# Patient Record
Sex: Female | Born: 1950 | Race: White | Hispanic: No | Marital: Married | Smoking: Never smoker
Health system: Southern US, Community
[De-identification: ages and names within clinical notes are randomized; demographics above are authoritative.]

## PROBLEM LIST (undated history)

## (undated) DIAGNOSIS — J42 Unspecified chronic bronchitis: Secondary | ICD-10-CM

## (undated) DIAGNOSIS — G4733 Obstructive sleep apnea (adult) (pediatric): Principal | ICD-10-CM

## (undated) DIAGNOSIS — K589 Irritable bowel syndrome without diarrhea: Secondary | ICD-10-CM

## (undated) DIAGNOSIS — E7800 Pure hypercholesterolemia, unspecified: Secondary | ICD-10-CM

## (undated) DIAGNOSIS — K5792 Diverticulitis of intestine, part unspecified, without perforation or abscess without bleeding: Secondary | ICD-10-CM

## (undated) DIAGNOSIS — Z8619 Personal history of other infectious and parasitic diseases: Secondary | ICD-10-CM

## (undated) DIAGNOSIS — M1990 Unspecified osteoarthritis, unspecified site: Secondary | ICD-10-CM

## (undated) DIAGNOSIS — F329 Major depressive disorder, single episode, unspecified: Secondary | ICD-10-CM

## (undated) DIAGNOSIS — I493 Ventricular premature depolarization: Secondary | ICD-10-CM

## (undated) DIAGNOSIS — R51 Headache: Secondary | ICD-10-CM

## (undated) DIAGNOSIS — G4761 Periodic limb movement disorder: Secondary | ICD-10-CM

## (undated) DIAGNOSIS — Z973 Presence of spectacles and contact lenses: Secondary | ICD-10-CM

## (undated) DIAGNOSIS — C50919 Malignant neoplasm of unspecified site of unspecified female breast: Secondary | ICD-10-CM

## (undated) DIAGNOSIS — S83209A Unspecified tear of unspecified meniscus, current injury, unspecified knee, initial encounter: Secondary | ICD-10-CM

## (undated) DIAGNOSIS — F419 Anxiety disorder, unspecified: Secondary | ICD-10-CM

## (undated) DIAGNOSIS — C801 Malignant (primary) neoplasm, unspecified: Secondary | ICD-10-CM

## (undated) DIAGNOSIS — F32A Depression, unspecified: Secondary | ICD-10-CM

## (undated) DIAGNOSIS — IMO0001 Reserved for inherently not codable concepts without codable children: Secondary | ICD-10-CM

## (undated) DIAGNOSIS — IMO0002 Reserved for concepts with insufficient information to code with codable children: Secondary | ICD-10-CM

## (undated) DIAGNOSIS — K219 Gastro-esophageal reflux disease without esophagitis: Secondary | ICD-10-CM

## (undated) DIAGNOSIS — J189 Pneumonia, unspecified organism: Secondary | ICD-10-CM

## (undated) HISTORY — DX: Headache: R51

## (undated) HISTORY — PX: KNEE SURGERY: SHX244

## (undated) HISTORY — PX: OSTEOTOMY: SHX137

## (undated) HISTORY — DX: Gastro-esophageal reflux disease without esophagitis: K21.9

## (undated) HISTORY — DX: Depression, unspecified: F32.A

## (undated) HISTORY — DX: Pure hypercholesterolemia, unspecified: E78.00

## (undated) HISTORY — DX: Obstructive sleep apnea (adult) (pediatric): G47.33

## (undated) HISTORY — PX: DILATION AND CURETTAGE OF UTERUS: SHX78

## (undated) HISTORY — DX: Malignant neoplasm of unspecified site of unspecified female breast: C50.919

## (undated) HISTORY — DX: Reserved for concepts with insufficient information to code with codable children: IMO0002

## (undated) HISTORY — DX: Irritable bowel syndrome, unspecified: K58.9

## (undated) HISTORY — DX: Reserved for inherently not codable concepts without codable children: IMO0001

## (undated) HISTORY — DX: Major depressive disorder, single episode, unspecified: F32.9

## (undated) HISTORY — DX: Ventricular premature depolarization: I49.3

## (undated) HISTORY — DX: Periodic limb movement disorder: G47.61

---

## 1982-08-01 HISTORY — PX: APPENDECTOMY: SHX54

## 2005-09-09 ENCOUNTER — Other Ambulatory Visit: Admission: RE | Admit: 2005-09-09 | Discharge: 2005-09-09 | Payer: Self-pay | Admitting: Obstetrics and Gynecology

## 2008-01-29 ENCOUNTER — Ambulatory Visit: Payer: Self-pay | Admitting: Internal Medicine

## 2008-02-12 ENCOUNTER — Ambulatory Visit: Payer: Self-pay | Admitting: Internal Medicine

## 2008-03-17 ENCOUNTER — Ambulatory Visit: Payer: Self-pay | Admitting: Internal Medicine

## 2008-12-15 ENCOUNTER — Emergency Department (HOSPITAL_COMMUNITY): Admission: EM | Admit: 2008-12-15 | Discharge: 2008-12-15 | Payer: Self-pay | Admitting: Emergency Medicine

## 2010-11-09 LAB — POCT I-STAT, CHEM 8
Calcium, Ion: 1.12 mmol/L (ref 1.12–1.32)
Chloride: 107 mEq/L (ref 96–112)
Glucose, Bld: 141 mg/dL — ABNORMAL HIGH (ref 70–99)
HCT: 45 % (ref 36.0–46.0)

## 2010-11-09 LAB — DIFFERENTIAL
Basophils Absolute: 0 10*3/uL (ref 0.0–0.1)
Lymphocytes Relative: 2 % — ABNORMAL LOW (ref 12–46)
Lymphs Abs: 0.3 10*3/uL — ABNORMAL LOW (ref 0.7–4.0)
Monocytes Absolute: 0.2 10*3/uL (ref 0.1–1.0)
Monocytes Relative: 2 % — ABNORMAL LOW (ref 3–12)
Neutro Abs: 11.6 10*3/uL — ABNORMAL HIGH (ref 1.7–7.7)

## 2010-11-09 LAB — CBC
Hemoglobin: 15.1 g/dL — ABNORMAL HIGH (ref 12.0–15.0)
RBC: 4.65 MIL/uL (ref 3.87–5.11)
WBC: 12.1 10*3/uL — ABNORMAL HIGH (ref 4.0–10.5)

## 2010-11-09 LAB — COMPREHENSIVE METABOLIC PANEL
Albumin: 3.8 g/dL (ref 3.5–5.2)
BUN: 14 mg/dL (ref 6–23)
Chloride: 106 mEq/L (ref 96–112)
Creatinine, Ser: 0.82 mg/dL (ref 0.4–1.2)
GFR calc non Af Amer: >60 mL/min (ref >60)
Glucose, Bld: 139 mg/dL — ABNORMAL HIGH (ref 70–99)
Total Bilirubin: 0.8 mg/dL (ref 0.3–1.2)

## 2010-11-09 LAB — URINE MICROSCOPIC-ADD ON

## 2010-11-09 LAB — LIPASE, BLOOD: Lipase: 22 U/L (ref 11–59)

## 2010-11-09 LAB — URINALYSIS, ROUTINE W REFLEX MICROSCOPIC
Glucose, UA: NEGATIVE mg/dL
Ketones, ur: NEGATIVE mg/dL
Protein, ur: NEGATIVE mg/dL

## 2013-03-06 ENCOUNTER — Encounter: Payer: Self-pay | Admitting: Neurology

## 2013-03-06 ENCOUNTER — Ambulatory Visit (INDEPENDENT_AMBULATORY_CARE_PROVIDER_SITE_OTHER): Payer: BC Managed Care – PPO | Admitting: Neurology

## 2013-03-06 VITALS — BP 125/78 | HR 65 | Temp 97.8°F | Ht 63.0 in | Wt 192.0 lb

## 2013-03-06 DIAGNOSIS — G4733 Obstructive sleep apnea (adult) (pediatric): Secondary | ICD-10-CM

## 2013-03-06 NOTE — Patient Instructions (Signed)

## 2013-03-06 NOTE — Progress Notes (Signed)
Subjective:    Patient ID: Connie [name] is a 62 y.o. female.  HPI  Huston Foley, MD, PhD [facility] [address] [address] [place], [place] [address]  Dear Dr. Clelia Croft,   I saw your patient, Connie [name], upon Your kind request in my neurologic clinic today for initial consultation of her sleep disorder, in particular concern for obstructive sleep apnea. The patient is unaccompanied today. As you know, Ms. Tozer is a very pleasant 62 year old right-handed woman with an underlying medical history of hypertension, lung disease, osteoarthritis, reflux disease, hyperlipidemia, depression, migraines and irritable bowel syndrome as well as overweight status has been experiencing daytime somnolence for several months, especially on her days off. 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 She is known to snore.  Her typical bedtime is reported to be around 10 to 11 PM and usual wake time is around 5 AM. Sleep onset typically occurs within a few minutes, but she does take benadryl 50 mg each night. She reports feeling occasionally rested upon awakening. She wakes up on an average 2 in the middle of the night and has to go to the bathroom 1 times on a typical night. She admits to rare morning headaches.  She reports excessive daytime somnolence (EDS) and Her Epworth Sleepiness Score (ESS) is 12/24 today. She has not fallen asleep while driving. The patient has been taking an unplanned nap on her days off, which is usually 45 minutes to 1 hour long. She denies dreaming in a nap and reports feeling refreshed after a nap.  She has been known to snore for the past many years. Snoring is reportedly moderate to loud, and associated with choking sounds. The patient denies a sense of choking or strangling feeling. There is no report of nighttime reflux, with rare nighttime cough experienced. The patient has not noted any RLS symptoms and is not known to  kick while asleep or before falling asleep. She is a restless sleeper and in the morning, the bed is quite disheveled.   She denies cataplexy, sleep paralysis, hypnagogic or hypnopompic hallucinations, or sleep attacks. She does not report any vivid dreams, nightmares, dream enactments, or parasomnias, such as sleep talking, but she has some sleep walking. The patient has not had a sleep study or a home sleep test.  She consumes 1 caffeinated beverage per day, usually in the form of coffee.   Her Past Medical History Is Significant For: Past Medical History  Diagnosis Date  . IBS (irritable bowel syndrome)   . Anemia   . GERD (gastroesophageal reflux disease)   . Chronic bronchitis   . Headache(784.0)   . High cholesterol   . Depression     Her Past Surgical History Is Significant For: Past Surgical History  Procedure Laterality Date  . Knee surgery Left 2000-2001    total of 2  . Appendectomy  1984    Her Family History Is Significant For: Family History  Problem Relation Age of Onset  . Cancer Father     Her Social History Is Significant For: History   Social History  . Marital Status: Married    Spouse Name: N/A    Number of Children: 4  . Years of Education: college   Occupational History  . [employer].    Social History Main Topics  . Smoking status: Never Smoker   . Smokeless tobacco: None  . Alcohol Use: Yes  .  Drug Use: No  . Sexually Active: None   Other Topics Concern  . None   Social History Narrative  . None    Her Allergies Are:  No Known Allergies:   Her Current Medications Are:  Outpatient Encounter Prescriptions as of 03/06/2013  Medication Sig Dispense Refill  . aspirin 81 MG tablet Take 81 mg by mouth daily.      Marland Kitchen atenolol (TENORMIN) 25 MG tablet Take 25 mg by mouth daily.       Marland Kitchen atorvastatin (LIPITOR) 40 MG tablet Take 40 mg by mouth daily.       Marland Kitchen b complex vitamins tablet Take 1 tablet by mouth daily.      .  butalbital-aspirin-caffeine (FIORINAL) 50-325-40 MG per capsule 2 capsules 2 (two) times daily as needed.       . cyclobenzaprine (FLEXERIL) 10 MG tablet Take 10 mg by mouth daily as needed for muscle spasms.      Marland Kitchen estradiol (ESTRACE) 0.5 MG tablet Take 0.5 mg by mouth daily.       . magnesium 30 MG tablet Take 400 mg by mouth daily.      . medroxyPROGESTERone (PROVERA) 2.5 MG tablet Take 2.5 mg by mouth daily.       . Multiple Vitamin (MULTIVITAMIN) tablet Take 1 tablet by mouth daily.      Marland Kitchen omeprazole (PRILOSEC) 10 MG capsule Take 10 mg by mouth daily.      . sertraline (ZOLOFT) 100 MG tablet Take 100 mg by mouth daily.       . traMADol (ULTRAM) 50 MG tablet Take 50 mg by mouth every 6 (six) hours as needed for pain.      . [DISCONTINUED] fluticasone (FLONASE) 50 MCG/ACT nasal spray       . [DISCONTINUED] hyoscyamine (LEVSIN, ANASPAZ) 0.125 MG tablet        No facility-administered encounter medications on file as of 03/06/2013.   Review of Systems  Constitutional: Positive for fatigue.  Respiratory:       Snoring  Musculoskeletal:       Joint pain , Aching Muscles  Allergic/Immunologic: Positive for environmental allergies.  Neurological: Positive for headaches.  Psychiatric/Behavioral:       Depression    Objective:  Neurologic Exam  Physical Exam Physical Examination:   Filed Vitals:   03/06/13 0954  BP: 125/78  Pulse: 65  Temp: 97.8 F (36.6 C)    General Examination: The patient is a very pleasant 62 y.o. female in no acute distress. She appears well-developed and well-nourished and well groomed.   HEENT: Normocephalic, atraumatic, pupils are equal, round and reactive to light and accommodation. Funduscopic exam is normal with sharp disc margins noted. Extraocular tracking is good without limitation to gaze excursion or nystagmus noted. Normal smooth pursuit is noted. Hearing is grossly intact. Tympanic membranes are clear bilaterally. Face is symmetric with normal  facial animation and normal facial sensation. Speech is clear with no dysarthria noted. There is no hypophonia. There is no lip, neck/head, jaw or voice tremor. Neck is supple with full range of passive and active motion. There are no carotid bruits on auscultation. Oropharynx exam reveals: mild mouth dryness, adequate dental hygiene and moderate airway crowding, due to narrow airway, tonsillar size of 1+ and elongated uvula. Mallampati is class II. Tongue protrudes centrally and palate elevates symmetrically. Neck size is 15.5 inches.   Chest: Clear to auscultation without wheezing, rhonchi or crackles noted.  Heart: S1+S2+0, regular and normal without murmurs,  rubs or gallops noted.   Abdomen: Soft, non-tender and non-distended with normal bowel sounds appreciated on auscultation.  Extremities: There is no pitting edema in the distal lower extremities bilaterally. Pedal pulses are intact.  Skin: Warm and dry without trophic changes noted. There are no varicose veins.  Musculoskeletal: exam reveals no obvious joint deformities, tenderness or joint swelling or erythema.   Neurologically:  Mental status: The patient is awake, alert and oriented in all 4 spheres. Her memory, attention, language and knowledge are appropriate. There is no aphasia, agnosia, apraxia or anomia. Speech is clear with normal prosody and enunciation. Thought process is linear. Mood is congruent and affect is normal.  Cranial nerves are as described above under HEENT exam. In addition, shoulder shrug is normal with equal shoulder height noted. Motor exam: Normal bulk, strength and tone is noted. There is no drift, tremor or rebound. Romberg is negative. Reflexes are 2+ throughout. Toes are downgoing bilaterally. Fine motor skills are intact with normal finger taps, normal hand movements, normal rapid alternating patting, normal foot taps and normal foot agility.  Cerebellar testing shows no dysmetria or intention tremor on  finger to nose testing. Heel to shin is unremarkable bilaterally. There is no truncal or gait ataxia.  Sensory exam is intact to light touch, pinprick, vibration, temperature sense and proprioception in the upper and lower extremities.  Gait, station and balance are unremarkable. No veering to one side is noted. No leaning to one side is noted. Posture is age-appropriate and stance is narrow based. No problems turning are noted. She turns en bloc. Tandem walk is unremarkable. Intact toe and heel stance is noted.                Assessment and Plan:   In summary, CAMI DELAWDER is a very pleasant 62 y.o.-year old female with a history and physical exam concerning for obstructive sleep apnea (OSA). I had a long chat with the patient about my findings and the diagnosis, its prognosis and treatment options. We talked about medical treatments and non-pharmacological approaches. I explained in particular the risks and ramifications of untreated moderate to severe OSA, especially with respect to developing cardiovascular disease down the Road, including congestive heart failure, difficult to treat hypertension, cardiac arrhythmias, or stroke. Even type 2 diabetes has in part been linked to untreated OSA. We talked about trying to maintain a healthy lifestyle in general, as well as the importance of weight control. I encouraged the patient to eat healthy, exercise daily and keep well hydrated, to keep a scheduled bedtime and wake time routine, to not skip any meals and eat healthy snacks in between meals.  I recommended the following at this time: sleep study with potential CPAP titration.  I explained the sleep test procedure to the patient and also outlined surgical and non-surgical treatment options of OSA including the use of a dental custom-made appliance, upper airway surgery such as pillar implants, radiofrequency surgery, tongue base surgery, and UPPP. I also explained the CPAP treatment option to the  patient, who indicated that she would be willing to try CPAP if the need arises. I explained the importance of being compliant with PAP treatment, not only for insurance purposes but primarily for the patient's long term health benefit. I answered all her questions today and the patient was in agreement. I would like to see her back after the sleep study is completed and encouraged her to call with any interim questions, concerns, problems or updates.  Thank you very much for allowing me to participate in the care of this nice patient. If I can be of any further assistance to you please do not hesitate to call me at [phone].  Sincerely,   Star Age, MD, PhD

## 2013-03-08 ENCOUNTER — Ambulatory Visit (INDEPENDENT_AMBULATORY_CARE_PROVIDER_SITE_OTHER): Payer: BC Managed Care – PPO | Admitting: Neurology

## 2013-03-08 VITALS — BP 128/82 | HR 62 | Ht 63.0 in | Wt 192.0 lb

## 2013-03-08 DIAGNOSIS — R9431 Abnormal electrocardiogram [ECG] [EKG]: Secondary | ICD-10-CM

## 2013-03-08 DIAGNOSIS — G4733 Obstructive sleep apnea (adult) (pediatric): Secondary | ICD-10-CM

## 2013-03-08 DIAGNOSIS — G479 Sleep disorder, unspecified: Secondary | ICD-10-CM

## 2013-03-08 DIAGNOSIS — G4761 Periodic limb movement disorder: Secondary | ICD-10-CM

## 2013-03-20 ENCOUNTER — Telehealth: Payer: Self-pay | Admitting: Neurology

## 2013-03-20 NOTE — Telephone Encounter (Signed)
Please call and notify the patient that the recent sleep study showed overall mild obstructive sleep apnea, moderate in REM sleep. Because of the fact that she did not sleep on her back at all during the study her numbers may represent an under estimation of her obstructive sleep apnea if she usually also sleeps on her back. Nevertheless she did drop her oxygen level to 85% and even her history of hypertension and her complaint of daytime sleepiness I think it is reasonable to try her on CPAP. We did not try her on CPAP during this last study because she manifested with sleep apnea more significantly during dream sleep and that was delayed. I feel it is appropriate to try her on CPAP. This would require a separate sleep test for full night titration and appropriate mask fitting. If she is okay and uses to go ahead I will order a CPAP titration test. Please let me know after talking to the patient. Also, route or fax report to PCP and referring MD, if other than PCP.   Thanks,  Huston Foley, MD, PhD [facility])

## 2013-03-27 ENCOUNTER — Encounter: Payer: Self-pay | Admitting: *Deleted

## 2013-03-27 NOTE — Telephone Encounter (Signed)
Called patient to discuss sleep study results.  Discussed findings, recommendations and follow up care.  Patient understood well and all questions were answered.   We discussed that this study may underestimate the severity of her OSA and that it was mild overall but became more moderate in REM sleep.  We discussed that treatment options should be considered including CPAP therapy.  Other alternative therapies can be considered as well and should be discussed with Dr. Frances Furbish to make a plan.  Patient stated she was traveling and would like to call me tomorrow to discuss further, then the call dropped.  I will mail copy of the report to patient and also send copy to referring MD.  Then I will wait for this patient's call tomorrow to schedule her for either a CPAP Titration or a follow up consult with Dr. Frances Furbish to discuss treatment options. -sh

## 2013-04-03 ENCOUNTER — Ambulatory Visit: Payer: BC Managed Care – PPO | Admitting: Neurology

## 2013-04-18 ENCOUNTER — Ambulatory Visit: Payer: BC Managed Care – PPO | Attending: Physician Assistant

## 2013-04-18 DIAGNOSIS — R269 Unspecified abnormalities of gait and mobility: Secondary | ICD-10-CM | POA: Insufficient documentation

## 2013-04-18 DIAGNOSIS — M25569 Pain in unspecified knee: Secondary | ICD-10-CM | POA: Insufficient documentation

## 2013-04-18 DIAGNOSIS — R5381 Other malaise: Secondary | ICD-10-CM | POA: Insufficient documentation

## 2013-04-18 DIAGNOSIS — IMO0001 Reserved for inherently not codable concepts without codable children: Secondary | ICD-10-CM | POA: Insufficient documentation

## 2013-04-30 ENCOUNTER — Encounter: Payer: Self-pay | Admitting: Neurology

## 2013-04-30 ENCOUNTER — Ambulatory Visit (INDEPENDENT_AMBULATORY_CARE_PROVIDER_SITE_OTHER): Payer: BC Managed Care – PPO | Admitting: Neurology

## 2013-04-30 VITALS — BP 117/77 | HR 70 | Temp 98.4°F | Ht 63.0 in | Wt 184.0 lb

## 2013-04-30 DIAGNOSIS — G4733 Obstructive sleep apnea (adult) (pediatric): Secondary | ICD-10-CM | POA: Insufficient documentation

## 2013-04-30 DIAGNOSIS — I10 Essential (primary) hypertension: Secondary | ICD-10-CM | POA: Insufficient documentation

## 2013-04-30 DIAGNOSIS — I4949 Other premature depolarization: Secondary | ICD-10-CM

## 2013-04-30 DIAGNOSIS — G4761 Periodic limb movement disorder: Secondary | ICD-10-CM

## 2013-04-30 DIAGNOSIS — I493 Ventricular premature depolarization: Secondary | ICD-10-CM

## 2013-04-30 HISTORY — DX: Ventricular premature depolarization: I49.3

## 2013-04-30 HISTORY — DX: Periodic limb movement disorder: G47.61

## 2013-04-30 HISTORY — DX: Obstructive sleep apnea (adult) (pediatric): G47.33

## 2013-04-30 NOTE — Progress Notes (Signed)
Subjective:    Patient ID: Connie [name] is a 62 y.o. female.  HPI  Interim history:   Connie [name] is a very pleasant 62 year old right-handed woman with an underlying medical history of hypertension, lung disease, osteoarthritis, reflux disease, hyperlipidemia, depression, migraines, IBS and obesity, who presents for FU consultation after her recent sleep study. She is unaccompanied today. I first met her on 03/06/2013, which time she reported daytime somnolence. Her Epworth sleepiness score was 12/24 at the time. She also reported snoring. 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 Based on her history and exam I felt she had findings concerning for OSA and asked her to come back for sleep study. She had a diagnostic polysomnogram on 03/08/2013 and I went over her test results with her in detail today. Sleep efficiency was normal at 91.1% with a latency to sleep of 17 minutes. Wake after sleep onset was 27 minutes with moderate to severe sleep fragmentation noted. She had an increased percentage of stage I and 2 sleep, near absence of slow-wave sleep and a decreased percentage of REM sleep at 10.7% with a highly prolonged REM latency of 245 minutes. She had mild periodic leg movements at 18.6 per hour resulting in very mild arousals of 3.3 per hour. She had occasional PVCs. She had mild snoring. She had a total of 39 obstructive hypopneas, rendering a borderline elevated AHI of 5.2 events per hour, with further elevation to 19.8 per hour in REM sleep. Her baseline oxygen saturation was 94%, her nadir was 85% and she spent 13 minutes and 8 seconds below the saturation of 90%. She reports no significant changes in her symptoms since she was last seen. She has had discomfort in her knees and had X rays on her L knee. She prefers sleeping on the R side, sometimes on her stomach. She denies frank RLS. She was switched from Ultram to Tylenol #3; she does not  take it at work. She denies any recent chest pain or shortness of breath or palpitations. In the past when she was still residing in [place] she had some chest pain and had a heart cath which was unremarkable per her report. She says that her primary care physician checks her EKG regularly. She has no symptoms from her occasional PVCs that we saw on the sleep study.  Her Past Medical History Is Significant For: Past Medical History  Diagnosis Date  . IBS (irritable bowel syndrome)   . Anemia   . GERD (gastroesophageal reflux disease)   . Chronic bronchitis   . Headache(784.0)   . High cholesterol   . Depression     Her Past Surgical History Is Significant For: Past Surgical History  Procedure Laterality Date  . Knee surgery Left 2000-2001    total of 2  . Appendectomy  1984    Her Family History Is Significant For: Family History  Problem Relation Age of Onset  . Cancer Father     Her Social History Is Significant For: History   Social History  . Marital Status: Married    Spouse Name: N/A    Number of Children: 4  . Years of Education: college   Occupational History  . [employer].    Social History Main Topics  . Smoking status: Never Smoker   . Smokeless tobacco: None  . Alcohol Use: Yes  . Drug Use: No  . Sexual Activity: None  Other Topics Concern  . None   Social History Narrative  . None    Her Allergies Are:  No Known Allergies:   Her Current Medications Are:  Outpatient Encounter Prescriptions as of 04/30/2013  Medication Sig Dispense Refill  . acetaminophen-codeine (TYLENOL #3) 300-30 MG per tablet Take 1 tablet by mouth as needed.      Marland Kitchen aspirin 81 MG tablet Take 81 mg by mouth daily.      Marland Kitchen atenolol (TENORMIN) 25 MG tablet Take 25 mg by mouth daily.       Marland Kitchen atorvastatin (LIPITOR) 40 MG tablet Take 40 mg by mouth daily.       Marland Kitchen b complex vitamins tablet Take 1 tablet by mouth daily.      . butalbital-aspirin-caffeine (FIORINAL)  50-325-40 MG per capsule 2 capsules 2 (two) times daily as needed.       . cyclobenzaprine (FLEXERIL) 10 MG tablet Take 10 mg by mouth daily as needed for muscle spasms.      Marland Kitchen estradiol (ESTRACE) 0.5 MG tablet Take 0.5 mg by mouth daily.       . fluticasone (FLONASE) 50 MCG/ACT nasal spray Place 2 sprays into the nose daily.      . magnesium 30 MG tablet Take 400 mg by mouth daily.      . medroxyPROGESTERone (PROVERA) 2.5 MG tablet Take 2.5 mg by mouth daily.       . Multiple Vitamin (MULTIVITAMIN) tablet Take 1 tablet by mouth daily.      Marland Kitchen omeprazole (PRILOSEC) 10 MG capsule Take 10 mg by mouth daily.      . sertraline (ZOLOFT) 100 MG tablet Take 100 mg by mouth daily.       . traMADol (ULTRAM) 50 MG tablet Take 50 mg by mouth every 6 (six) hours as needed for pain.       No facility-administered encounter medications on file as of 04/30/2013.  :  Review of Systems  Musculoskeletal: Positive for arthralgias.    Objective:  Neurologic Exam  Physical Exam Physical Examination:   Filed Vitals:   04/30/13 0901  BP: 117/77  Pulse: 70  Temp: 98.4 F (36.9 C)    General Examination: The patient is a very pleasant 62 y.o. female in no acute distress. She appears well-developed and well-nourished and well groomed.   HEENT: Normocephalic, atraumatic, pupils are equal, round and reactive to light and accommodation. Extraocular tracking is good without limitation to gaze excursion or nystagmus noted. Normal smooth pursuit is noted. Hearing is grossly intact. Face is symmetric with normal facial animation and normal facial sensation. Speech is clear with no dysarthria noted. There is no hypophonia. There is no lip, neck/head, jaw or voice tremor. Neck is supple with full range of passive and active motion. There are no carotid bruits on auscultation. Oropharynx exam reveals: mild mouth dryness, adequate dental hygiene and moderate airway crowding, due to narrow airway, tonsillar size of 1+ and  elongated uvula. Mallampati is class II. Tongue protrudes centrally and palate elevates symmetrically. She has a fairly significant overbite.   Chest: Clear to auscultation without wheezing, rhonchi or crackles noted.  Heart: S1+S2+0, regular and normal without murmurs, rubs or gallops noted.   Abdomen: Soft, non-tender and non-distended with normal bowel sounds appreciated on auscultation.  Extremities: There is no pitting edema in the distal lower extremities bilaterally. Pedal pulses are intact.  Skin: Warm and dry without trophic changes noted. There are no varicose veins.  Musculoskeletal: exam reveals  no obvious joint deformities, tenderness or joint swelling or erythema.   Neurologically:  Mental status: The patient is awake, alert and oriented in all 4 spheres. Her memory, attention, language and knowledge are appropriate. There is no aphasia, agnosia, apraxia or anomia. Speech is clear with normal prosody and enunciation. Thought process is linear. Mood is congruent and affect is normal.  Cranial nerves are as described above under HEENT exam.  Motor exam: Normal bulk, strength and tone is noted. There is no drift, tremor or rebound. Romberg is negative. Reflexes are 2+ throughout. Toes are downgoing bilaterally. Fine motor skills are intact.  Cerebellar testing shows no dysmetria or intention tremor. There is no truncal or gait ataxia.  Sensory exam is intact to light touch.  Gait, station and balance are unremarkable. No veering to one side is noted. No leaning to one side is noted. Posture is age-appropriate and stance is narrow based. No problems turning are noted. She turns en bloc.                  Most of our 35 minute visit was spent in counseling and coordination of care and reviewing test results. Assessment and Plan:   In summary, JENNETT TARBELL is a very pleasant 62 year old female with a history of hypertension, lung disease, osteoarthritis, reflux disease,  hyperlipidemia, depression, migraines, IBS and obesity, who recently had a baseline sleep study which confirmed overall mild obstructive sleep apnea, moderate and REM sleep with a desaturation nadir of 85%. Her physical exam remains unchanged. I had a long chat with the patient regarding her sleep study findings and the diagnosis of OSA, its prognosis and treatment options. We talked about medical treatments and non-pharmacological approaches. I again explained explained the risks and ramifications of untreated moderate to severe OSA, especially with respect to developing cardiovascular disease down the Road. Given her symptoms of nonrestorative sleep and daytime tiredness, I do believe a trial of CPAP will be reasonable. We also talked alternative treatments including an oral appliance which could be a good backup plan. She would be willing to try CPAP at this juncture. I would like to get her started on an auto Pap trial for about 30 days and see her back fairly soon thereafter. I explained to her that based on the compliance report we can make a decision about setting her pressure rather than having range of pressures. She was in agreement. To that end I will order CPAP for her and see her back in about a month and a half. We again talked about trying to maintain a healthy lifestyle in general, as well as the importance of weight control. She feels that she would be able to do more physical exercise if she had the energy to do that. I am hopeful that with a CPAP she will feel less tired during the day and fine more energy to work out. I encouraged the patient to eat healthy, exercise daily and keep well hydrated, to keep a scheduled bedtime and wake time routine, to not skip any meals and eat healthy snacks in between meals. She was in agreement with the above outlined plan.

## 2013-04-30 NOTE — Patient Instructions (Addendum)
Please start using your CPAP regularly. While your insurance requires that you use CPAP at least 4 hours each night on 70% of the nights, I recommend, that you not skip any nights and use it throughout the night if you can. Getting used to CPAP does take time and patience and discipline. Untreated obstructive sleep apnea when it is moderate to severe can have an adverse impact on cardiovascular health and raise her risk for heart disease, arrhythmias, hypertension, congestive heart failure, stroke and diabetes. Untreated obstructive sleep apnea causes sleep disruption, nonrestorative sleep, and sleep deprivation. This can have an impact on your day to day functioning and cause daytime sleepiness and impairment of cognitive function, memory loss, mood disturbance, and problems focussing. Using CPAP regularly can improve these symptoms.  I will see you back in about 6 weeks.  Please call us back before Thursday afternoon, if you have not heard back from your DME company about CPAP set up.

## 2013-05-02 ENCOUNTER — Ambulatory Visit: Payer: BC Managed Care – PPO | Attending: Physician Assistant | Admitting: Physical Therapy

## 2013-05-02 DIAGNOSIS — M25569 Pain in unspecified knee: Secondary | ICD-10-CM | POA: Insufficient documentation

## 2013-05-02 DIAGNOSIS — R269 Unspecified abnormalities of gait and mobility: Secondary | ICD-10-CM | POA: Insufficient documentation

## 2013-05-02 DIAGNOSIS — IMO0001 Reserved for inherently not codable concepts without codable children: Secondary | ICD-10-CM | POA: Insufficient documentation

## 2013-05-02 DIAGNOSIS — R5381 Other malaise: Secondary | ICD-10-CM | POA: Insufficient documentation

## 2013-05-02 NOTE — Telephone Encounter (Signed)
Pt met with Dr. Frances Furbish on 04/30/13 and was ready to get started with auto CPAP therapy.  Orders were faxed by Jasmine December to [facility] and fax confirmation was received.   Dr. Frances Furbish mentioned to pt to call us if she hadn't heard anything by Thursday (today).  Patient called this morning to let me know she hadn't heard from them.  I called and LM and sent e-mail to Fayrene Fearing to verify that the order was received and asked them to call us if they needed it.  If they had it, asked them to call her to let her know they had received it and let her know what to expect as far as setup goes. -sh

## 2013-05-09 ENCOUNTER — Ambulatory Visit: Payer: BC Managed Care – PPO | Admitting: Physical Therapy

## 2013-05-13 ENCOUNTER — Ambulatory Visit: Payer: BC Managed Care – PPO

## 2013-05-16 ENCOUNTER — Ambulatory Visit: Payer: BC Managed Care – PPO

## 2013-06-03 ENCOUNTER — Other Ambulatory Visit: Payer: Self-pay | Admitting: Neurology

## 2013-06-03 ENCOUNTER — Encounter: Payer: Self-pay | Admitting: Neurology

## 2013-06-03 DIAGNOSIS — G4733 Obstructive sleep apnea (adult) (pediatric): Secondary | ICD-10-CM

## 2013-06-03 NOTE — Progress Notes (Signed)
I am going to change patient based on her compliance data from [facility] to set CPAP at 8 cm water pressure.

## 2013-06-03 NOTE — Progress Notes (Signed)
Quick Note:  I reviewed the patient's auto-PAP compliance data from 05/03/2013 to 05/25/2013, which is a total of 23 days, during which time the patient used CPAP every day. The average usage for all days was 7 hours and 38 minutes. The percent used days greater than 4 hours was 100 %, indicating excellent compliance. The residual AHI was 0.7 per hour. Her mean pressure was 6.8 cm, her average pressure was 8.2 cm indicating an appropriate treatment pressure range. I will go ahead and request that her pressure be set to 8 cwp with EPR as needed. I will review this data with the patient at the next office visit, provide feedback and additional troubleshooting if need be. In the interim, I will go ahead and send an order for CPAP to her existing DME company.  Huston Foley, MD, PhD [facility] ([facility]   ______

## 2013-06-04 ENCOUNTER — Encounter: Payer: Self-pay | Admitting: Neurology

## 2013-06-06 ENCOUNTER — Other Ambulatory Visit: Payer: Self-pay

## 2013-06-10 ENCOUNTER — Encounter: Payer: Self-pay | Admitting: Neurology

## 2013-06-10 ENCOUNTER — Ambulatory Visit (INDEPENDENT_AMBULATORY_CARE_PROVIDER_SITE_OTHER): Payer: BC Managed Care – PPO | Admitting: Neurology

## 2013-06-10 VITALS — BP 113/68 | HR 75 | Temp 98.0°F | Ht 63.0 in | Wt 195.0 lb

## 2013-06-10 DIAGNOSIS — G4733 Obstructive sleep apnea (adult) (pediatric): Secondary | ICD-10-CM

## 2013-06-10 DIAGNOSIS — G4761 Periodic limb movement disorder: Secondary | ICD-10-CM

## 2013-06-10 DIAGNOSIS — I4949 Other premature depolarization: Secondary | ICD-10-CM

## 2013-06-10 DIAGNOSIS — M25569 Pain in unspecified knee: Secondary | ICD-10-CM

## 2013-06-10 DIAGNOSIS — I493 Ventricular premature depolarization: Secondary | ICD-10-CM

## 2013-06-10 DIAGNOSIS — M25561 Pain in right knee: Secondary | ICD-10-CM

## 2013-06-10 NOTE — Patient Instructions (Addendum)
Please continue using your CPAP regularly. While your insurance requires that you use CPAP at least 4 hours each night on 70% of the nights, I recommend, that you not skip any nights and use it throughout the night if you can. Getting used to CPAP does take time and patience and discipline. Untreated obstructive sleep apnea when it is moderate to severe can have an adverse impact on cardiovascular health and raise her risk for heart disease, arrhythmias, hypertension, congestive heart failure, stroke and diabetes. Untreated obstructive sleep apnea causes sleep disruption, nonrestorative sleep, and sleep deprivation. This can have an impact on your day to day functioning and cause daytime sleepiness and impairment of cognitive function, memory loss, mood disturbance, and problems focussing. Using CPAP regularly can improve these symptoms.  You have done really well and I would like to see you back in 6 months.

## 2013-06-10 NOTE — Progress Notes (Signed)
Subjective:    Patient ID: Connie [name] is a 62 y.o. female.  HPI  Interim history:   Connie [name] is a very pleasant 62 year old right-handed woman with an underlying medical history of hypertension, lung disease, osteoarthritis, reflux disease, hyperlipidemia, depression, migraines, IBS and obesity, who presents for FU consultation of her obstructive sleep apnea. She is unaccompanied today. I last saw her on 04/30/2013, which time we discussed her sleep study findings and I suggested a trial of CPAP in the form of AutoPap. She was agreeable to this and I wanted to see her back after about a month of trying CPAP. I also reviewed compliance data in the interim from 05/03/2013 to 05/25/2013 which is a total of 23 days during which time she uses CPAP every day. Percent used days greater than 4 hours was 100%. Her CPAP mean pressure was 6.8, the 90th percentile was 8.2 cm. Her residual AHI was 0.7 per hour. Her average usage for all days was 7 hours and 38 minutes. This indicates excellent compliance. Also, based on the pressure summary I changed her CPAP setting to a set pressure of 8 cm. She uses a nasal pillows and tolerates it well. She sleeps better and feels better rested. She had no changes in her medical Hx of medications.  Her pressure will be adjusted in 2 days from now. She has an appointment with her DME company. I first met her on 03/06/2013, which time she reported daytime somnolence and snoring. 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 Based on her history and exam I felt she had findings concerning for OSA and asked her to come back for sleep study. She had a diagnostic polysomnogram on 03/08/2013 and I went over her test results with her last time. Sleep efficiency was normal at 91.1% with a latency to sleep of 17 minutes. Wake after sleep onset was 27 minutes with moderate to severe sleep fragmentation noted. She had an increased percentage of stage I and 2 sleep,  near absence of slow-wave sleep and a decreased percentage of REM sleep at 10.7% with a highly prolonged REM latency of 245 minutes. She had mild periodic leg movements at 18.6 per hour resulting in very mild arousals of 3.3 per hour. She had occasional PVCs. She had mild snoring. She had a total of 39 obstructive hypopneas, rendering a borderline elevated AHI of 5.2 events per hour, with further elevation to 19.8 per hour in REM sleep. Her baseline oxygen saturation was 94%, her nadir was 85% and she spent 13 minutes and 8 seconds below the saturation of 90%.  She denies any recent chest pain or shortness of breath or palpitations. Her primary care physician checks her EKG regularly. She has no symptoms from her occasional PVCs that we saw on the sleep study.  Her Past Medical History Is Significant For: Past Medical History  Diagnosis Date  . IBS (irritable bowel syndrome)   . Anemia   . GERD (gastroesophageal reflux disease)   . Chronic bronchitis   . Headache(784.0)   . High cholesterol   . Depression   . OSA (obstructive sleep apnea) 04/30/2013  . PLMD (periodic limb movement disorder) 04/30/2013  . PVC's (premature ventricular contractions) 04/30/2013    Her Past Surgical History Is Significant For: Past Surgical History  Procedure Laterality Date  . Knee surgery Left 2000-2001    total of 2  . Appendectomy  1984    Her  Family History Is Significant For: Family History  Problem Relation Age of Onset  . Cancer Father     Her Social History Is Significant For: History   Social History  . Marital Status: Married    Spouse Name: N/A    Number of Children: 4  . Years of Education: college   Occupational History  . [employer]    Social History Main Topics  . Smoking status: Never Smoker   . Smokeless tobacco: None  . Alcohol Use: Yes  . Drug Use: No  . Sexual Activity: None   Other Topics Concern  . None   Social History Narrative  . None    Her Allergies  Are:  No Known Allergies:   Her Current Medications Are:  Outpatient Encounter Prescriptions as of 06/10/2013  Medication Sig  . acetaminophen-codeine (TYLENOL #3) 300-30 MG per tablet Take 1 tablet by mouth as needed.  Marland Kitchen aspirin 81 MG tablet Take 81 mg by mouth daily.  Marland Kitchen atenolol (TENORMIN) 25 MG tablet Take 25 mg by mouth daily.   Marland Kitchen atorvastatin (LIPITOR) 40 MG tablet Take 40 mg by mouth daily.   Marland Kitchen b complex vitamins tablet Take 1 tablet by mouth daily.  . butalbital-aspirin-caffeine (FIORINAL) 50-325-40 MG per capsule 2 capsules 2 (two) times daily as needed.   . cyclobenzaprine (FLEXERIL) 10 MG tablet Take 10 mg by mouth daily as needed for muscle spasms.  Marland Kitchen estradiol (ESTRACE) 0.5 MG tablet Take 0.5 mg by mouth daily.   . fluticasone (FLONASE) 50 MCG/ACT nasal spray Place 2 sprays into the nose daily.  . hyoscyamine (LEVSIN, ANASPAZ) 0.125 MG tablet Take 1 tablet by mouth as needed.  . magnesium 30 MG tablet Take 400 mg by mouth daily.  . medroxyPROGESTERone (PROVERA) 2.5 MG tablet Take 2.5 mg by mouth daily.   . Multiple Vitamin (MULTIVITAMIN) tablet Take 1 tablet by mouth daily.  Marland Kitchen omeprazole (PRILOSEC) 10 MG capsule Take 10 mg by mouth daily.  . sertraline (ZOLOFT) 100 MG tablet Take 100 mg by mouth daily.   . [DISCONTINUED] traMADol (ULTRAM) 50 MG tablet Take 50 mg by mouth every 6 (six) hours as needed for pain.  :  Review of Systems:  Out of a complete 14 point review of systems, all are reviewed and negative with the exception of these symptoms as listed below:   Review of Systems  Constitutional: Negative.   HENT: Negative.   Eyes: Negative.   Respiratory: Negative.   Cardiovascular: Negative.   Gastrointestinal: Negative.   Endocrine: Negative.   Genitourinary: Negative.   Musculoskeletal: Negative.   Skin: Negative.   Allergic/Immunologic: Negative.   Neurological: Negative.   Hematological: Negative.   Psychiatric/Behavioral: Negative.   All other systems  reviewed and are negative.    Objective:  Neurologic Exam  Physical Exam Physical Examination:   Filed Vitals:   06/10/13 1543  BP: 113/68  Pulse: 75  Temp: 98 F (36.7 C)   General Examination: The patient is a very pleasant 62 y.o. female in no acute distress. She appears well-developed and well-nourished and well groomed.   HEENT: Normocephalic, atraumatic, pupils are equal, round and reactive to light and accommodation. Extraocular tracking is good without limitation to gaze excursion or nystagmus noted. Normal smooth pursuit is noted. Hearing is grossly intact. Face is symmetric with normal facial animation and normal facial sensation. Speech is clear with no dysarthria noted. There is no hypophonia. There is no lip, neck/head, jaw or voice tremor. Neck  is supple with full range of passive and active motion. There are no carotid bruits on auscultation. Oropharynx exam reveals: mild mouth dryness, adequate dental hygiene and moderate airway crowding, due to narrow airway, tonsillar size of 1+ and elongated uvula. Mallampati is class II. Tongue protrudes centrally and palate elevates symmetrically. She has a fairly significant overbite.   Chest: Clear to auscultation without wheezing, rhonchi or crackles noted.  Heart: S1+S2+0, regular and normal without murmurs, rubs or gallops noted.   Abdomen: Soft, non-tender and non-distended with normal bowel sounds appreciated on auscultation.  Extremities: There is no pitting edema in the distal lower extremities bilaterally. Pedal pulses are intact.  Skin: Warm and dry without trophic changes noted. There are no varicose veins.  Musculoskeletal: exam reveals no obvious joint deformities, tenderness or joint swelling or erythema.   Neurologically:  Mental status: The patient is awake, alert and oriented in all 4 spheres. Her memory, attention, language and knowledge are appropriate. There is no aphasia, agnosia, apraxia or anomia. Speech  is clear with normal prosody and enunciation. Thought process is linear. Mood is congruent and affect is normal.  Cranial nerves are as described above under HEENT exam.  Motor exam: Normal bulk, strength and tone is noted. There is no drift, tremor or rebound. Romberg is negative. Reflexes are 2+ throughout. Fine motor skills are intact.  Cerebellar testing shows no dysmetria or intention tremor. There is no truncal or gait ataxia.  Sensory exam is intact to light touch.  Gait, station and balance are unremarkable. No veering to one side is noted. No leaning to one side is noted. Posture is age-appropriate and stance is narrow based. No problems turning are noted. She turns en bloc.      Assessment and Plan:   In summary, FELICE HOPE is a very pleasant 62 y.o.-year old female with a history of OSA, on CPAP treatment with AutoPap and sewn at a set pressure of 8. She indicates good results and improvement of her sleep and improvement of her daytime somnolence. I congratulated her on her for compliance and encouraged her to continue using CPAP regularly. She indicates good tolerance of the mask and pressure. I reviewed the compliance data with the patient and encouraged her to continue to use CPAP regularly to help reduce cardiovascular risk and, of course, to continue to improve her symptoms. She had no questions today regarding CPAP treatment. She indicated that she will be going part-time in the beginning of next year.  I answered all her questions today and the patient was in agreement with the above outlined plan. I would like to see the patient back in 6 months, sooner if the need arises and encouraged her to call with any interim questions, concerns, problems or updates. Hopefully, if she continues to do well I can see her back yearly after that.

## 2013-06-25 ENCOUNTER — Encounter: Payer: Self-pay | Admitting: Neurology

## 2013-06-30 NOTE — Progress Notes (Signed)
Quick Note:  I reviewed the patient's AutoPap compliance data from 05/23/2013 to 06/21/2013, which is a total of 30 days, during which time the patient used CPAP every day. The average usage for all days was 7 hours and 20 minutes. The percent used days greater than 4 hours was 100%, indicating excellent compliance. The residual AHI was 1 per hour. Her 90th percentile pressure was 8.1 cm. I will therefore change her from AutoPap to a set pressure of 8 cm before her next visit. I previously entered an order to change her setting from auto PAP to CPAP of 8 cm. I will review this data with the patient at the next office visit, provide feedback and additional troubleshooting if need be.  Huston Foley, MD, PhD [facility]   ______

## 2013-07-02 ENCOUNTER — Encounter: Payer: Self-pay | Admitting: Neurology

## 2013-07-02 NOTE — Progress Notes (Signed)
Quick Note:  I reviewed the patient's most recent CPAP compliance data from 06/11/2013 to 06/21/2013, which is a total of 11 days, during which time the patient used CPAP every day. The average usage for all days was 7 hours and 15 minutes. The percent used days greater than 4 hours was 100%, indicating excellent compliance. The residual AHI was 0.9 per hour, indicating an appropriate treatment pressure of 8 cwp with EPR. I will review this data with the patient at the next office visit, provide feedback and additional troubleshooting if need be.  Huston Foley, MD, PhD [facility]   ______

## 2013-07-03 ENCOUNTER — Encounter: Payer: Self-pay | Admitting: Neurology

## 2013-08-02 ENCOUNTER — Other Ambulatory Visit: Payer: Self-pay | Admitting: Orthopedic Surgery

## 2013-08-19 ENCOUNTER — Encounter (HOSPITAL_COMMUNITY): Payer: Self-pay | Admitting: Pharmacy Technician

## 2013-08-22 ENCOUNTER — Encounter (HOSPITAL_COMMUNITY)
Admission: RE | Admit: 2013-08-22 | Discharge: 2013-08-22 | Disposition: A | Discharge status: Home / Self Care | Payer: BC Managed Care – PPO | Source: Ambulatory Visit | Attending: Orthopedic Surgery | Admitting: Orthopedic Surgery

## 2013-08-22 ENCOUNTER — Encounter (HOSPITAL_COMMUNITY): Payer: Self-pay

## 2013-08-22 ENCOUNTER — Ambulatory Visit (HOSPITAL_COMMUNITY)
Admission: RE | Admit: 2013-08-22 | Discharge: 2013-08-22 | Disposition: A | Discharge status: Home / Self Care | Payer: BC Managed Care – PPO | Source: Ambulatory Visit | Attending: Orthopedic Surgery | Admitting: Orthopedic Surgery

## 2013-08-22 DIAGNOSIS — Z01818 Encounter for other preprocedural examination: Secondary | ICD-10-CM | POA: Insufficient documentation

## 2013-08-22 DIAGNOSIS — Z01810 Encounter for preprocedural cardiovascular examination: Secondary | ICD-10-CM | POA: Insufficient documentation

## 2013-08-22 DIAGNOSIS — Z01812 Encounter for preprocedural laboratory examination: Secondary | ICD-10-CM | POA: Insufficient documentation

## 2013-08-22 HISTORY — DX: Unspecified chronic bronchitis: J42

## 2013-08-22 HISTORY — DX: Unspecified tear of unspecified meniscus, current injury, unspecified knee, initial encounter: S83.209A

## 2013-08-22 HISTORY — DX: Unspecified osteoarthritis, unspecified site: M19.90

## 2013-08-22 LAB — BASIC METABOLIC PANEL
BUN: 14 mg/dL (ref 6–23)
CHLORIDE: 102 meq/L (ref 96–112)
CO2: 25 mEq/L (ref 19–32)
Calcium: 9.3 mg/dL (ref 8.4–10.5)
Creatinine, Ser: 0.7 mg/dL (ref 0.50–1.10)
GFR calc Af Amer: >90 mL/min (ref >90)
Glucose, Bld: 104 mg/dL — ABNORMAL HIGH (ref 70–99)
POTASSIUM: 3.9 meq/L (ref 3.7–5.3)
Sodium: 138 mEq/L (ref 137–147)

## 2013-08-22 LAB — CBC
HEMATOCRIT: 41.3 % (ref 36.0–46.0)
Hemoglobin: 14 g/dL (ref 12.0–15.0)
MCH: 31.3 pg (ref 26.0–34.0)
MCHC: 33.9 g/dL (ref 30.0–36.0)
MCV: 92.2 fL (ref 78.0–100.0)
Platelets: 216 10*3/uL (ref 150–400)
RBC: 4.48 MIL/uL (ref 3.87–5.11)
RDW: 12.8 % (ref 11.5–15.5)
WBC: 7.5 10*3/uL (ref 4.0–10.5)

## 2013-08-22 NOTE — Patient Instructions (Signed)
Connie [name]  08/22/2013                           YOUR PROCEDURE IS SCHEDULED ON: 08/28/13               PLEASE REPORT TO SHORT STAY CENTER AT : 8:15 AM               CALL THIS NUMBER IF ANY PROBLEMS THE DAY OF SURGERY :               [phone]                      REMEMBER:   Do not eat food or drink liquids AFTER MIDNIGHT   Take these medicines the morning of surgery with A SIP OF WATER: ATENOLOL / HRT / PRILOSEC   Do not wear jewelry, make-up   Do not wear lotions, powders, or perfumes.   Do not shave legs or underarms 12 hrs. before surgery (men may shave face)  Do not bring valuables to the hospital.  Contacts, dentures or bridgework may not be worn into surgery.  Leave suitcase in the car. After surgery it may be brought to your room.  For patients admitted to the hospital more than one night, checkout time is 11:00                          The day of discharge.   Patients discharged the day of surgery will not be allowed to drive home                             If going home same day of surgery, must have someone stay with you first                           24 hrs at home and arrange for some one to drive you home from hospital.    Special Instructions:   Please read over the following fact sheets that you were given:               1. INCENTIVE SPIROMETER                      2. [place]                3. BRING C PAP [place]                                                X_____________________________________________________________________        Failure to follow these instructions may result in cancellation of your surgery

## 2013-08-27 DIAGNOSIS — S83249A Other tear of medial meniscus, current injury, unspecified knee, initial encounter: Secondary | ICD-10-CM | POA: Diagnosis present

## 2013-08-27 NOTE — H&P (Signed)
[name] is a 63 y.o. female who presents with left knee pain.  HPI- . Knee Pain: Patient presents with knee pain involving the  left knee. Onset of the symptoms was several months ago. Inciting event: none known. Current symptoms include giving out, pain located medial and lateral and stiffness. Pain is aggravated by kneeling, lateral movements, pivoting, rising after sitting and squatting.  Patient has had prior knee problems. Evaluation to date: MRI: abnormal medial and lateral meniscal tears. Treatment to date: corticosteroid injection which was somewhat effective and rest.  Past Medical History  Diagnosis Date  . IBS (irritable bowel syndrome)   . GERD (gastroesophageal reflux disease)   . High cholesterol   . Depression   . PLMD (periodic limb movement disorder) 04/30/2013  . PVC's (premature ventricular contractions) 04/30/2013  . Bronchitis, chronic   . Headache(784.0)     MIGRAINES  . Meniscus tear     LEFT  . Arthritis     L FOOT  . OSA (obstructive sleep apnea) 04/30/2013    USES C -PAP    Past Surgical History  Procedure Laterality Date  . Knee surgery Left 2000-2001     RT KNEE X1  . Appendectomy  1984  . Osteotomy      RT KNEE    Prior to Admission medications   Medication Sig Start Date End Date Taking? Authorizing Provider  acetaminophen-codeine (TYLENOL #3) 300-30 MG per tablet Take 1-2 tablets by mouth every evening.  04/10/13   Historical Provider, MD  aspirin 81 MG tablet Take 81 mg by mouth daily.    Historical Provider, MD  atenolol (TENORMIN) 25 MG tablet Take 25 mg by mouth every morning.  01/04/13   Historical Provider, MD  atorvastatin (LIPITOR) 40 MG tablet Take 40 mg by mouth every evening.  01/25/13   Historical Provider, MD  b complex vitamins tablet Take 1 tablet by mouth daily.    Historical Provider, MD  butalbital-aspirin-caffeine [facility]) [phone] MG per capsule Take 1-2 capsules by mouth 2 (two) times daily as needed for  headache.  12/19/12   Historical Provider, MD  cholecalciferol (VITAMIN D) 1000 UNITS tablet Take 1,000 Units by mouth daily.    Historical Provider, MD  cyclobenzaprine (FLEXERIL) 10 MG tablet Take 10 mg by mouth daily as needed for muscle spasms.    Historical Provider, MD  estradiol (ESTRACE) 0.5 MG tablet Take 0.5 mg by mouth daily.  01/22/13   Historical Provider, MD  glucosamine-chondroitin 500-400 MG tablet Take 1 tablet by mouth 2 (two) times daily.    Historical Provider, MD  magnesium oxide (MAG-OX) 400 MG tablet Take 400 mg by mouth daily.    Historical Provider, MD  medroxyPROGESTERone (PROVERA) 2.5 MG tablet Take 2.5 mg by mouth daily.  01/02/13   Historical Provider, MD  Multiple Vitamin (MULTIVITAMIN) tablet Take 1 tablet by mouth daily.    Historical Provider, MD  omega-3 acid ethyl esters (LOVAZA) 1 G capsule Take 1 g by mouth 2 (two) times daily.    Historical Provider, MD  omeprazole (PRILOSEC OTC) 20 MG tablet Take 20 mg by mouth daily.    Historical Provider, MD  sertraline (ZOLOFT) 100 MG tablet Take 100 mg by mouth every evening.  01/24/13   Historical Provider, MD  Triamcinolone Acetonide (NASACORT AQ NA) Place 1 spray into the nose every evening.    Historical Provider, MD   KNEE EXAM antalgic gait, soft tissue tenderness over medial/lateral joint lines, no effusion, negative pivot-shift,  collateral ligaments intact  Physical Examination: General appearance - alert, well appearing, and in no distress Mental status - alert, oriented to person, place, and time Chest - clear to auscultation, no wheezes, rales or rhonchi, symmetric air entry Heart - normal rate, regular rhythm, normal S1, S2, no murmurs, rubs, clicks or gallops Abdomen - soft, nontender, nondistended, no masses or organomegaly Neurological - alert, oriented, normal speech, no focal findings or movement disorder noted    Asessment/Plan--- Left knee medial meniscal tear- - Plan left knee arthroscopy with  meniscal debridement. Procedure risks and potential comps discussed with patient who elects to proceed. Goals are decreased pain and increased function with a high likelihood of achieving both

## 2013-08-28 ENCOUNTER — Encounter (HOSPITAL_COMMUNITY)
Admission: RE | Disposition: A | Discharge status: Home / Self Care | Payer: Self-pay | Source: Ambulatory Visit | Attending: Orthopedic Surgery

## 2013-08-28 ENCOUNTER — Encounter (HOSPITAL_COMMUNITY): Payer: Self-pay | Admitting: *Deleted

## 2013-08-28 ENCOUNTER — Encounter (HOSPITAL_COMMUNITY): Payer: BC Managed Care – PPO | Admitting: Certified Registered Nurse Anesthetist

## 2013-08-28 ENCOUNTER — Ambulatory Visit (HOSPITAL_COMMUNITY): Payer: BC Managed Care – PPO | Admitting: Certified Registered Nurse Anesthetist

## 2013-08-28 ENCOUNTER — Ambulatory Visit (HOSPITAL_COMMUNITY)
Admission: RE | Admit: 2013-08-28 | Discharge: 2013-08-28 | Disposition: A | Discharge status: Home / Self Care | Payer: BC Managed Care – PPO | Source: Ambulatory Visit | Attending: Orthopedic Surgery | Admitting: Orthopedic Surgery

## 2013-08-28 DIAGNOSIS — M19079 Primary osteoarthritis, unspecified ankle and foot: Secondary | ICD-10-CM | POA: Insufficient documentation

## 2013-08-28 DIAGNOSIS — S83289A Other tear of lateral meniscus, current injury, unspecified knee, initial encounter: Secondary | ICD-10-CM | POA: Insufficient documentation

## 2013-08-28 DIAGNOSIS — IMO0002 Reserved for concepts with insufficient information to code with codable children: Secondary | ICD-10-CM | POA: Insufficient documentation

## 2013-08-28 DIAGNOSIS — G4733 Obstructive sleep apnea (adult) (pediatric): Secondary | ICD-10-CM | POA: Insufficient documentation

## 2013-08-28 DIAGNOSIS — F329 Major depressive disorder, single episode, unspecified: Secondary | ICD-10-CM | POA: Insufficient documentation

## 2013-08-28 DIAGNOSIS — E7800 Pure hypercholesterolemia, unspecified: Secondary | ICD-10-CM | POA: Insufficient documentation

## 2013-08-28 DIAGNOSIS — G259 Extrapyramidal and movement disorder, unspecified: Secondary | ICD-10-CM | POA: Insufficient documentation

## 2013-08-28 DIAGNOSIS — F3289 Other specified depressive episodes: Secondary | ICD-10-CM | POA: Insufficient documentation

## 2013-08-28 DIAGNOSIS — I4949 Other premature depolarization: Secondary | ICD-10-CM | POA: Insufficient documentation

## 2013-08-28 DIAGNOSIS — K589 Irritable bowel syndrome without diarrhea: Secondary | ICD-10-CM | POA: Insufficient documentation

## 2013-08-28 DIAGNOSIS — K219 Gastro-esophageal reflux disease without esophagitis: Secondary | ICD-10-CM | POA: Insufficient documentation

## 2013-08-28 DIAGNOSIS — S83249A Other tear of medial meniscus, current injury, unspecified knee, initial encounter: Secondary | ICD-10-CM | POA: Diagnosis present

## 2013-08-28 DIAGNOSIS — Z7982 Long term (current) use of aspirin: Secondary | ICD-10-CM | POA: Insufficient documentation

## 2013-08-28 DIAGNOSIS — G43909 Migraine, unspecified, not intractable, without status migrainosus: Secondary | ICD-10-CM | POA: Insufficient documentation

## 2013-08-28 DIAGNOSIS — X58XXXA Exposure to other specified factors, initial encounter: Secondary | ICD-10-CM | POA: Insufficient documentation

## 2013-08-28 DIAGNOSIS — M2240 Chondromalacia patellae, unspecified knee: Secondary | ICD-10-CM | POA: Insufficient documentation

## 2013-08-28 DIAGNOSIS — I10 Essential (primary) hypertension: Secondary | ICD-10-CM | POA: Insufficient documentation

## 2013-08-28 DIAGNOSIS — Z9089 Acquired absence of other organs: Secondary | ICD-10-CM | POA: Insufficient documentation

## 2013-08-28 DIAGNOSIS — J42 Unspecified chronic bronchitis: Secondary | ICD-10-CM | POA: Insufficient documentation

## 2013-08-28 DIAGNOSIS — Z79899 Other long term (current) drug therapy: Secondary | ICD-10-CM | POA: Insufficient documentation

## 2013-08-28 HISTORY — PX: KNEE ARTHROSCOPY: SHX127

## 2013-08-28 SURGERY — ARTHROSCOPY, KNEE
Anesthesia: General | Site: Knee | Laterality: Left

## 2013-08-28 MED ORDER — FENTANYL CITRATE 0.05 MG/ML IJ SOLN
INTRAMUSCULAR | Status: DC | PRN
  Administered 2013-08-28 (×2): 50 ug via INTRAVENOUS
  Administered 2013-08-28 (×2): 25 ug via INTRAVENOUS
  Administered 2013-08-28: 50 ug via INTRAVENOUS

## 2013-08-28 MED ORDER — ONDANSETRON HCL 4 MG/2ML IJ SOLN
INTRAMUSCULAR | Status: DC | PRN
  Administered 2013-08-28: 4 mg via INTRAVENOUS

## 2013-08-28 MED ORDER — CHLORHEXIDINE GLUCONATE 4 % EX LIQD: 60.0000 mL | Freq: Once | CUTANEOUS | Status: DC

## 2013-08-28 MED ORDER — FENTANYL CITRATE 0.05 MG/ML IJ SOLN
INTRAMUSCULAR | Status: AC
  Filled 2013-08-28: qty 2

## 2013-08-28 MED ORDER — SODIUM CHLORIDE 0.9 % IV SOLN: INTRAVENOUS | Status: DC

## 2013-08-28 MED ORDER — BUPIVACAINE-EPINEPHRINE 0.25% -1:200000 IJ SOLN
INTRAMUSCULAR | Status: DC | PRN
  Administered 2013-08-28: 20 mL

## 2013-08-28 MED ORDER — DEXAMETHASONE SODIUM PHOSPHATE 10 MG/ML IJ SOLN
10.0000 mg | Freq: Once | INTRAMUSCULAR | Status: AC
  Administered 2013-08-28: 10 mg via INTRAVENOUS

## 2013-08-28 MED ORDER — MIDAZOLAM HCL 2 MG/2ML IJ SOLN
INTRAMUSCULAR | Status: AC
  Filled 2013-08-28: qty 2

## 2013-08-28 MED ORDER — ONDANSETRON HCL 4 MG/2ML IJ SOLN
INTRAMUSCULAR | Status: AC
  Filled 2013-08-28: qty 2

## 2013-08-28 MED ORDER — CEFAZOLIN SODIUM-DEXTROSE 2-3 GM-% IV SOLR
INTRAVENOUS | Status: AC
  Filled 2013-08-28: qty 50

## 2013-08-28 MED ORDER — BUPIVACAINE-EPINEPHRINE PF 0.25-1:200000 % IJ SOLN
INTRAMUSCULAR | Status: AC
  Filled 2013-08-28: qty 30

## 2013-08-28 MED ORDER — HYDROCODONE-ACETAMINOPHEN 5-325 MG PO TABS: 1.0000 | ORAL_TABLET | Freq: Four times a day (QID) | ORAL | Status: DC | PRN

## 2013-08-28 MED ORDER — MIDAZOLAM HCL 5 MG/5ML IJ SOLN
INTRAMUSCULAR | Status: DC | PRN
  Administered 2013-08-28: 2 mg via INTRAVENOUS

## 2013-08-28 MED ORDER — HYDROMORPHONE HCL PF 1 MG/ML IJ SOLN
0.2500 mg | INTRAMUSCULAR | Status: DC | PRN
  Administered 2013-08-28 (×2): 0.5 mg via INTRAVENOUS

## 2013-08-28 MED ORDER — LIDOCAINE HCL (CARDIAC) 20 MG/ML IV SOLN
INTRAVENOUS | Status: DC | PRN
  Administered 2013-08-28: 100 mg via INTRAVENOUS

## 2013-08-28 MED ORDER — HYDROMORPHONE HCL PF 1 MG/ML IJ SOLN
INTRAMUSCULAR | Status: AC
  Filled 2013-08-28: qty 1

## 2013-08-28 MED ORDER — DEXAMETHASONE SODIUM PHOSPHATE 10 MG/ML IJ SOLN
INTRAMUSCULAR | Status: AC
  Filled 2013-08-28: qty 1

## 2013-08-28 MED ORDER — PROMETHAZINE HCL 25 MG/ML IJ SOLN: 6.2500 mg | INTRAMUSCULAR | Status: DC | PRN

## 2013-08-28 MED ORDER — LIDOCAINE HCL (CARDIAC) 20 MG/ML IV SOLN
INTRAVENOUS | Status: AC
  Filled 2013-08-28: qty 5

## 2013-08-28 MED ORDER — PROPOFOL 10 MG/ML IV BOLUS
INTRAVENOUS | Status: DC | PRN
  Administered 2013-08-28: 150 mg via INTRAVENOUS

## 2013-08-28 MED ORDER — CEFAZOLIN SODIUM-DEXTROSE 2-3 GM-% IV SOLR
2.0000 g | INTRAVENOUS | Status: AC
  Administered 2013-08-28: 2 g via INTRAVENOUS

## 2013-08-28 MED ORDER — SODIUM CHLORIDE 0.9 % IR SOLN
Status: DC | PRN
  Administered 2013-08-28: 4000 mL

## 2013-08-28 MED ORDER — LACTATED RINGERS IV SOLN
INTRAVENOUS | Status: DC
  Administered 2013-08-28: 1000 mL via INTRAVENOUS

## 2013-08-28 MED ORDER — PROPOFOL 10 MG/ML IV BOLUS
INTRAVENOUS | Status: AC
  Filled 2013-08-28: qty 20

## 2013-08-28 MED ORDER — ACETAMINOPHEN 10 MG/ML IV SOLN
1000.0000 mg | Freq: Once | INTRAVENOUS | Status: AC
  Administered 2013-08-28: 1000 mg via INTRAVENOUS
  Filled 2013-08-28: qty 100

## 2013-08-28 MED ORDER — LACTATED RINGERS IV SOLN: INTRAVENOUS | Status: DC

## 2013-08-28 SURGICAL SUPPLY — 26 items
BANDAGE ELASTIC 6 VELCRO ST LF (GAUZE/BANDAGES/DRESSINGS) ×3 IMPLANT
BLADE 4.2CUDA (BLADE) ×3 IMPLANT
CLOTH BEACON ORANGE TIMEOUT ST (SAFETY) ×3 IMPLANT
COUNTER NEEDLE 20 DBL MAG RED (NEEDLE) ×3 IMPLANT
CUFF TOURN SGL QUICK 34 (TOURNIQUET CUFF) ×2
CUFF TRNQT CYL 34X4X40X1 (TOURNIQUET CUFF) ×1 IMPLANT
DRAPE U-SHAPE 47X51 STRL (DRAPES) ×3 IMPLANT
DRSG EMULSION OIL 3X3 NADH (GAUZE/BANDAGES/DRESSINGS) ×3 IMPLANT
DURAPREP 26ML APPLICATOR (WOUND CARE) ×3 IMPLANT
GLOVE BIO SURGEON STRL SZ8 (GLOVE) ×3 IMPLANT
GLOVE BIOGEL PI IND STRL 8 (GLOVE) ×1 IMPLANT
GLOVE BIOGEL PI INDICATOR 8 (GLOVE) ×2
GOWN STRL REUS W/TWL LRG LVL3 (GOWN DISPOSABLE) ×9 IMPLANT
MANIFOLD NEPTUNE II (INSTRUMENTS) ×3 IMPLANT
PACK ARTHROSCOPY WL (CUSTOM PROCEDURE TRAY) ×3 IMPLANT
PACK ICE MAXI GEL EZY WRAP (MISCELLANEOUS) ×9 IMPLANT
PAD ABD 8X10 STRL (GAUZE/BANDAGES/DRESSINGS) ×3 IMPLANT
PADDING CAST COTTON 6X4 STRL (CAST SUPPLIES) ×3 IMPLANT
POSITIONER SURGICAL ARM (MISCELLANEOUS) ×3 IMPLANT
SET ARTHROSCOPY TUBING (MISCELLANEOUS) ×2
SET ARTHROSCOPY TUBING LN (MISCELLANEOUS) ×1 IMPLANT
SPONGE GAUZE 4X4 12PLY (GAUZE/BANDAGES/DRESSINGS) ×3 IMPLANT
SUT ETHILON 4 0 PS 2 18 (SUTURE) ×3 IMPLANT
TOWEL OR 17X26 10 PK STRL BLUE (TOWEL DISPOSABLE) ×3 IMPLANT
WAND 90 DEG TURBOVAC W/CORD (SURGICAL WAND) ×3 IMPLANT
WRAP KNEE MAXI GEL POST OP (GAUZE/BANDAGES/DRESSINGS) ×3 IMPLANT

## 2013-08-28 NOTE — Discharge Instructions (Signed)

## 2013-08-28 NOTE — Transfer of Care (Signed)
Immediate Anesthesia Transfer of Care Note  Patient: Connie [name]  Procedure(s) Performed: Procedure(s) (LRB): LEFT KNEE ARTHROSCOPY WITH DEBRIDEMENT (Left)  Patient Location: PACU  Anesthesia Type: General  Level of Consciousness: sedated, patient cooperative and responds to stimulation  Airway & Oxygen Therapy: Patient Spontanous Breathing and Patient connected to face mask oxgen  Post-op Assessment: Report given to PACU RN and Post -op Vital signs reviewed and stable  Post vital signs: Reviewed and stable  Complications: No apparent anesthesia complications

## 2013-08-28 NOTE — Interval H&P Note (Signed)
History and Physical Interval Note:  08/28/2013 11:12 AM  Connie [name]  has presented today for surgery, with the diagnosis of LEFT KNEE  MEDIAL AND LATERAL MENISCAL TEAR  The various methods of treatment have been discussed with the patient and family. After consideration of risks, benefits and other options for treatment, the patient has consented to  Procedure(s): LEFT KNEE ARTHROSCOPY WITH DEBRIDEMENT (Left) as a surgical intervention .  The patient's history has been reviewed, patient examined, no change in status, stable for surgery.  I have reviewed the patient's chart and labs.  Questions were answered to the patient's satisfaction.     Gearlean Alf

## 2013-08-28 NOTE — Preoperative (Addendum)
Beta Blockers   Reason not to administer Beta Blockers:Not Applicable Patient took Beta Blocker today 08-28-13 AM 

## 2013-08-28 NOTE — Anesthesia Preprocedure Evaluation (Signed)
Anesthesia Evaluation  Patient identified by MRN, date of birth, ID band Patient awake    Reviewed: Allergy & Precautions, H&P , NPO status , Patient's Chart, lab work & pertinent test results  Airway Mallampati: II TM Distance: >3 FB Neck ROM: Full    Dental  (+) Caps, Teeth Intact and Dental Advisory Given   Pulmonary sleep apnea and Continuous Positive Airway Pressure Ventilation ,  breath sounds clear to auscultation  Pulmonary exam normal       Cardiovascular hypertension, Pt. on medications and Pt. on home beta blockers negative cardio ROS  + dysrhythmias Rhythm:Regular Rate:Normal     Neuro/Psych  Headaches, Depression    GI/Hepatic Neg liver ROS, GERD-  Medicated,  Endo/Other  negative endocrine ROS  Renal/GU negative Renal ROS  negative genitourinary   Musculoskeletal negative musculoskeletal ROS (+)   Abdominal   Peds  Hematology negative hematology ROS (+)   Anesthesia Other Findings   Reproductive/Obstetrics                           Anesthesia Physical Anesthesia Plan  ASA: II  Anesthesia Plan: General   Post-op Pain Management:    Induction: Intravenous  Airway Management Planned: LMA  Additional Equipment:   Intra-op Plan:   Post-operative Plan: Extubation in OR  Informed Consent: I have reviewed the patients History and Physical, chart, labs and discussed the procedure including the risks, benefits and alternatives for the proposed anesthesia with the patient or authorized representative who has indicated his/her understanding and acceptance.   Dental advisory given  Plan Discussed with: CRNA  Anesthesia Plan Comments:         Anesthesia Quick Evaluation

## 2013-08-28 NOTE — Anesthesia Postprocedure Evaluation (Signed)
Anesthesia Post Note  Patient: Connie [name]  Procedure(s) Performed: Procedure(s) (LRB): LEFT KNEE ARTHROSCOPY WITH MEDIAL AND LATERAL DEBRIDEMENT (Left)  Anesthesia type: General  Patient location: PACU  Post pain: Pain level controlled  Post assessment: Post-op Vital signs reviewed  Last Vitals:  Filed Vitals:   08/28/13 1218  BP: 116/67  Pulse: 60  Temp: 36.6 C  Resp: 14    Post vital signs: Reviewed  Level of consciousness: sedated  Complications: No apparent anesthesia complications

## 2013-08-28 NOTE — Op Note (Signed)
Preoperative diagnosis-  Left knee medial and lateral meniscal tears  Postoperative diagnosis Left- knee medial and lateral meniscal tears   Procedure- Left knee arthroscopy with medial and lateral meniscal debridement    Surgeon- Dione Plover. Hawke Villalpando, MD  Anesthesia-General  EBL-  minimal Complications- None  Condition- PACU - hemodynamically stable.  Brief clinical note- -Connie [name] is a 62 y.o.  female with a several month history of left knee pain and mechanical symptoms. Exam and history suggested medial meniscal tear confirmed by MRI. The patient presents now for arthroscopy and debridement   Procedure in detail -       After successful administration of General anesthetic, a tourmiquet is placed high on the Left  thigh and the Left lower extremity is prepped and draped in the usual sterile fashion. Time out is performed by the surgical team. Standard superomedial and inferolateral portal sites are marked and incisions made with an 11 blade. The inflow cannula is passed through the superomedial portal and camera through the inferolateral portal and inflow is initiated. Arthroscopic visualization proceeds.      The undersurface of the patella and trochlea are visualized and there is grade IV change medial trochlea otherwise patellofemoral joint is unremarkable. The medial and lateral gutters are visualized and there are  no loose bodies. Flexion and valgus force is applied to the knee and the medial compartment is entered. A spinal needle is passed into the joint through the site marked for the inferomedial portal. A small incision is made and the dilator passed into the joint. The findings for the medial compartment are small tear posterior horn medial meniscus with Grade II changes medial femoral condyle without unstable chondral lesions . The tear is debrided to a stable base with baskets and a shaver and sealed off with the Arthrocare.      The intercondylar notch is visualized  and the ACL appears normal. The lateral compartment is entered and the findings are tear of body and posterior horn lateral meniscus . The tear is debrided to a stable base with baskets and a shaver and sealed off with the Arthrocare.       The joint is again inspected and there are no other tears, defects or loose bodies identified. The arthroscopic equipment is then removed from the inferior portals which are closed with interrupted 4-0 nylon. 20 ml of .25% Marcaine with epinephrine are injected through the inflow cannula and the cannula is then removed and the portal closed with nylon. The incisions are cleaned and dried and a bulky sterile dressing is applied. The patient is then awakened and transported to recovery in stable condition.   08/28/2013, 12:11 PM

## 2013-08-29 ENCOUNTER — Encounter (HOSPITAL_COMMUNITY): Payer: Self-pay | Admitting: Orthopedic Surgery

## 2013-09-01 DIAGNOSIS — Z8619 Personal history of other infectious and parasitic diseases: Secondary | ICD-10-CM

## 2013-09-01 HISTORY — DX: Personal history of other infectious and parasitic diseases: Z86.19

## 2013-09-25 ENCOUNTER — Encounter: Payer: Self-pay | Admitting: Neurology

## 2013-09-27 NOTE — Progress Notes (Signed)
Quick Note:  I reviewed the patient's CPAP compliance data from 06/22/2013 to 09/19/2013, which is a total of 90 days, during which time the patient used CPAP every day. The average usage for all days was 7 hours and 53 minutes. The percent used days greater than 4 hours was 100 %, indicating excellent compliance. The residual AHI was 0.5 per hour, indicating an appropriate treatment pressure of 8 cwp. I will review this data with the patient at the next office visit, provide feedback and additional troubleshooting if need be. She is not scheduled for appointment as far as I can see and should be reminded to call to make an appointment.   Star Age, MD, PhD [facility] ([facility]   ______

## 2013-10-02 ENCOUNTER — Encounter: Payer: Self-pay | Admitting: Neurology

## 2013-10-29 ENCOUNTER — Other Ambulatory Visit: Payer: Self-pay | Admitting: Obstetrics & Gynecology

## 2013-10-29 DIAGNOSIS — N6325 Unspecified lump in the left breast, overlapping quadrants: Secondary | ICD-10-CM

## 2013-10-29 DIAGNOSIS — N6320 Unspecified lump in the left breast, unspecified quadrant: Principal | ICD-10-CM

## 2013-11-06 ENCOUNTER — Ambulatory Visit
Admission: RE | Admit: 2013-11-06 | Discharge: 2013-11-06 | Disposition: A | Discharge status: Home / Self Care | Payer: Self-pay | Source: Ambulatory Visit | Attending: Obstetrics & Gynecology | Admitting: Obstetrics & Gynecology

## 2013-11-06 ENCOUNTER — Other Ambulatory Visit: Payer: Self-pay | Admitting: Obstetrics & Gynecology

## 2013-11-06 ENCOUNTER — Ambulatory Visit
Admission: RE | Admit: 2013-11-06 | Discharge: 2013-11-06 | Disposition: A | Discharge status: Home / Self Care | Payer: BC Managed Care – PPO | Source: Ambulatory Visit | Attending: Obstetrics & Gynecology | Admitting: Obstetrics & Gynecology

## 2013-11-06 DIAGNOSIS — N6320 Unspecified lump in the left breast, unspecified quadrant: Principal | ICD-10-CM

## 2013-11-06 DIAGNOSIS — N6325 Unspecified lump in the left breast, overlapping quadrants: Secondary | ICD-10-CM

## 2013-11-13 ENCOUNTER — Ambulatory Visit
Admission: RE | Admit: 2013-11-13 | Discharge: 2013-11-13 | Disposition: A | Discharge status: Home / Self Care | Payer: BC Managed Care – PPO | Source: Ambulatory Visit | Attending: Obstetrics & Gynecology | Admitting: Obstetrics & Gynecology

## 2013-11-13 ENCOUNTER — Other Ambulatory Visit: Payer: Self-pay | Admitting: Obstetrics & Gynecology

## 2013-11-13 ENCOUNTER — Other Ambulatory Visit (HOSPITAL_COMMUNITY): Payer: Self-pay | Admitting: Diagnostic Radiology

## 2013-11-13 DIAGNOSIS — N6320 Unspecified lump in the left breast, unspecified quadrant: Principal | ICD-10-CM

## 2013-11-13 DIAGNOSIS — C50919 Malignant neoplasm of unspecified site of unspecified female breast: Secondary | ICD-10-CM

## 2013-11-13 DIAGNOSIS — N6325 Unspecified lump in the left breast, overlapping quadrants: Secondary | ICD-10-CM

## 2013-11-13 HISTORY — DX: Malignant neoplasm of unspecified site of unspecified female breast: C50.919

## 2013-11-14 ENCOUNTER — Other Ambulatory Visit: Payer: Self-pay | Admitting: Obstetrics & Gynecology

## 2013-11-14 DIAGNOSIS — C50919 Malignant neoplasm of unspecified site of unspecified female breast: Secondary | ICD-10-CM

## 2013-11-21 ENCOUNTER — Ambulatory Visit
Admission: RE | Admit: 2013-11-21 | Discharge: 2013-11-21 | Disposition: A | Discharge status: Home / Self Care | Payer: BC Managed Care – PPO | Source: Ambulatory Visit | Attending: Obstetrics & Gynecology | Admitting: Obstetrics & Gynecology

## 2013-11-21 DIAGNOSIS — C50919 Malignant neoplasm of unspecified site of unspecified female breast: Secondary | ICD-10-CM

## 2013-11-21 MED ORDER — GADOBENATE DIMEGLUMINE 529 MG/ML IV SOLN
18.0000 mL | Freq: Once | INTRAVENOUS | Status: AC | PRN
  Administered 2013-11-21: 18 mL via INTRAVENOUS

## 2013-11-25 ENCOUNTER — Ambulatory Visit (INDEPENDENT_AMBULATORY_CARE_PROVIDER_SITE_OTHER): Payer: BC Managed Care – PPO | Admitting: General Surgery

## 2013-11-25 ENCOUNTER — Encounter (INDEPENDENT_AMBULATORY_CARE_PROVIDER_SITE_OTHER): Payer: Self-pay | Admitting: General Surgery

## 2013-11-25 VITALS — BP 126/84 | HR 80 | Temp 97.7°F | Resp 14 | Ht 64.0 in | Wt 201.0 lb

## 2013-11-25 DIAGNOSIS — C50919 Malignant neoplasm of unspecified site of unspecified female breast: Secondary | ICD-10-CM

## 2013-11-25 DIAGNOSIS — C50312 Malignant neoplasm of lower-inner quadrant of left female breast: Secondary | ICD-10-CM | POA: Insufficient documentation

## 2013-11-25 DIAGNOSIS — Z170 Estrogen receptor positive status [ER+]: Secondary | ICD-10-CM

## 2013-11-25 DIAGNOSIS — C50912 Malignant neoplasm of unspecified site of left female breast: Secondary | ICD-10-CM

## 2013-11-25 NOTE — Patient Instructions (Signed)
IF YOU ARE TAKING ASPIRIN, COUMADIN/WARFARIN, PLAVIX, OR OTHER BLOOD THINNER, PLEASE LET US KNOW IMMEDIATELY.  WE WILL NEED TO DISCUSS WITH THE PRESCRIBING PROVIDER IF THESE ARE SAFE TO STOP. IF THESE ARE NOT STOPPED AT THE APPROPRIATE TIME, THIS WILL RESULT IN A DELAY FOR YOUR SURGERY.  DO NOT TAKE THESE MEDICATIONS OR IBUPROFEN/NAPROXEN WITHIN A WEEK BEFORE SURGERY.   The main risks of surgery are bleeding, infection, damage to other structures, and seroma (accumulation of fluid) under the incision site(s).    These complications may lead to additional procedures such as drainage of seroma/infection.  If cancer is found, you may need other surgeries to obtain negative margins or to take more lymph nodes.   Most women do accumulate fluid in the breast cavity where the specimen was removed. We do not always have to drain this fluid.  If your breast is very tense, painful, or red, then we may need to numb the skin and use a needle to aspirate the fluid.  We do provide patients with a Breast Binder.  The purpose of this is to avoid the use of tape on the sensitive tissue of the breast and to provide some compression to minimize the risk of seroma.  If the binder is uncomfortable, you may find that a tank top with a built-in shelf bra or a loose sports bra works better for you.  I recommend wearing this around the clock for the first 1-2 weeks except in the shower.    You may remove your dressings and may shower 48 hours after surgery.    Many patients have some constipation in the week after surgery due to the narcotics and anesthesia.  You may need over the counter stool softeners or laxatives if you experience difficulty having bowel movements.    If the following occur, call our office at [phone]: If you have a fever over 101 or pain that is severe despite narcotics. If you have redness or drainage at the wound. If you develop persistent nausea or vomiting.  I will follow you back up in  1-4 weeks.    Please submit any paperwork about time off work/insurance forms to the front desk.      

## 2013-11-25 NOTE — Progress Notes (Signed)
Chief Complaint  Patient presents with  . New Evaluation    eval lft breast CA    HISTORY: Pt is a 62 yo F who palpated a breast mass after christmas this year.  She has previously always had negative mammograms, and she was having knee surgery, so she did not get it checked out immediately.  She subsequently underwent mammogram and ultrasound which showed two masses, one near the other.  The biopsy was positive for invasive ductal carcinoma,  ER/PR +, Her 2 not overexpressed for the main mass.  The satellite nodule was benign.  She subsequently underwent MRI which did not have other findings.  She thinks the mass may have gotten larger in the last month.  No enlarged/suspicious LN were seen on imaging.   She does have an aunt with breast cancer who was BRCA positive, and two cousins (affected aunt's daughters) who had breast and/or ovarian cancer.   She denies breast pain.    Past Medical History  Diagnosis Date  . IBS (irritable bowel syndrome)   . GERD (gastroesophageal reflux disease)   . High cholesterol   . Depression   . PLMD (periodic limb movement disorder) 04/30/2013  . PVC's (premature ventricular contractions) 04/30/2013  . Bronchitis, chronic   . Headache(784.0)     MIGRAINES  . Meniscus tear     LEFT  . Arthritis     L FOOT  . OSA (obstructive sleep apnea) 04/30/2013    USES C -PAP    Past Surgical History  Procedure Laterality Date  . Knee surgery Left 2000-2001     RT KNEE X1  . Appendectomy  1984  . Osteotomy      RT KNEE  . Knee arthroscopy Left 08/28/2013    Procedure: LEFT KNEE ARTHROSCOPY WITH MEDIAL AND LATERAL DEBRIDEMENT;  Surgeon: Frank V Aluisio, MD;  Location: [facility];  Service: Orthopedics;  Laterality: Left;    Current Outpatient Prescriptions  Medication Sig Dispense Refill  . aspirin 81 MG tablet Take 81 mg by mouth daily.      . atenolol (TENORMIN) 25 MG tablet Take 25 mg by mouth every morning.       . atorvastatin (LIPITOR) 40 MG tablet Take  40 mg by mouth every evening.       . b complex vitamins tablet Take 1 tablet by mouth daily.      . butalbital-aspirin-caffeine (FIORINAL) 50-325-40 MG per capsule Take 1-2 capsules by mouth 2 (two) times daily as needed for headache.       . cholecalciferol (VITAMIN D) 1000 UNITS tablet Take 1,000 Units by mouth daily.      . cyclobenzaprine (FLEXERIL) 10 MG tablet Take 10 mg by mouth daily as needed for muscle spasms.      . estradiol (ESTRACE) 0.5 MG tablet Take 0.5 mg by mouth daily.       . fluticasone (FLONASE) 50 MCG/ACT nasal spray       . glucosamine-chondroitin 500-400 MG tablet Take 1 tablet by mouth 2 (two) times daily.      . HYDROcodone-acetaminophen (NORCO) 5-325 MG per tablet Take 1-2 tablets by mouth every 6 (six) hours as needed for moderate pain.  50 tablet  0  . magnesium oxide (MAG-OX) 400 MG tablet Take 400 mg by mouth daily.      . medroxyPROGESTERone (PROVERA) 2.5 MG tablet Take 2.5 mg by mouth daily.       . Multiple Vitamin (MULTIVITAMIN) tablet Take 1 tablet by mouth daily.      .   omega-3 acid ethyl esters (LOVAZA) 1 G capsule Take 1 g by mouth 2 (two) times daily.      Marland Kitchen omeprazole (PRILOSEC OTC) 20 MG tablet Take 20 mg by mouth daily.      . sertraline (ZOLOFT) 100 MG tablet Take 100 mg by mouth every evening.       . Triamcinolone Acetonide (NASACORT AQ NA) Place 1 spray into the nose every evening.      . valACYclovir (VALTREX) 1000 MG tablet        No current facility-administered medications for this visit.     No Known Allergies   Family History  Problem Relation Age of Onset  . Cancer Father      History   Social History  . Marital Status: Married    Spouse Name: N/A    Number of Children: 4  . Years of Education: college   Occupational History  . [employer]    Social History Main Topics  . Smoking status: Never Smoker   . Smokeless tobacco: None  . Alcohol Use: Yes     Comment: SOCIALLY  . Drug Use: No  . Sexual Activity: None    Other Topics Concern  . None   Social History Narrative  . None     REVIEW OF SYSTEMS - PERTINENT POSITIVES ONLY: 12 point review of systems negative other than HPI and PMH except for nasal congestion, headaches, and left knee pain.    EXAMDanley Danker Vitals:   11/25/13 1218  BP: 126/84  Pulse: 80  Temp: 97.7 F (36.5 C)  Resp: 14    Wt Readings from Last 3 Encounters:  11/25/13 201 lb (91.173 kg)  08/22/13 199 lb 4 oz (90.379 kg)  06/10/13 195 lb (88.451 kg)     Gen:  No acute distress.  Well nourished and well groomed.   Neurological: Alert and oriented to person, place, and time. Coordination normal.  Head: Normocephalic and atraumatic.  Eyes: Conjunctivae are normal. Pupils are equal, round, and reactive to light. No scleral icterus.  Neck: Normal range of motion. Neck supple. No tracheal deviation or thyromegaly present.  Cardiovascular: Normal rate, regular rhythm, normal heart sounds and intact distal pulses.  Exam reveals no gallop and no friction rub.  No murmur heard. Breast: palpable mass at 8:30 left breast.  Around 3 cm clinically.  Skin appears not to be involved.  There is left nipple retraction.  There is no LAD on either side.  The right side is without positive findings.  Breasts are dense bilaterally.   Respiratory: Effort normal.  No respiratory distress. No chest wall tenderness. Breath sounds normal.  No wheezes, rales or rhonchi.  GI: Soft. Bowel sounds are normal. The abdomen is soft and nontender.  There is no rebound and no guarding.  Musculoskeletal: Normal range of motion. Extremities are nontender.  Lymphadenopathy: No cervical, preauricular, postauricular or axillary adenopathy is present Skin: Skin is warm and dry. No rash noted. No diaphoresis. No erythema. No pallor. No clubbing, cyanosis, or edema.   Psychiatric: Normal mood and affect. Behavior is normal. Judgment and thought content normal.    LABORATORY RESULTS: Available labs are  reviewed   Pathology:  Diagnosis 1. Breast, left, needle core biopsy, mass, 8:30, 5 cm - INVASIVE DUCTAL CARCINOMA, SEE COMMENT. 2. Breast, left, needle core biopsy, mass, 8:30, 5 cm, medial to dom mass - BENIGN BREAST TISSUE, SEE COMMENT. - NEGATIVE FOR ATYPIA OR MALIGNANCY. ER/PR +, Her 2 neu not  overexpressed.  Ki67 89%  RADIOLOGY RESULTS: See E-Chart or I-Site for most recent results.  Images and reports are reviewed.  Mr Breast Bilateral W Wo Contrast  11/21/2013   CLINICAL DATA:  Patient underwent ultrasound-guided core needle biopsy of the dominant mass in the lower inner quadrant of the left breast with results indicating invasive ductal carcinoma, on 11/13/2013; satellite mass identified with second biopsy marker clip located chest lateral/anterior/cranial to the dominant mass by 1.9 cm was also biopsies with results indicating fibrocystic change  EXAM: BILATERAL BREAST MRI WITH AND WITHOUT CONTRAST  TECHNIQUE: Multiplanar, multisequence MR images of both breasts were obtained prior to and following the intravenous administration of 28m of MultiHance.  THREE-DIMENSIONAL MR IMAGE RENDERING ON INDEPENDENT WORKSTATION:  Three-dimensional MR images were rendered by post-processing of the original MR data on an independent workstation. The three-dimensional MR images were interpreted, and findings are reported in the following complete MRI report for this study. Three dimensional images were evaluated at the independent DynaCad workstation  COMPARISON:  Previous exams  FINDINGS: Breast composition: c:  Heterogeneous fibroglandular tissue  Background parenchymal enhancement: Moderate  Right breast: No mass or abnormal enhancement.  Left breast: In the 8 o'clock position of the left breast anteriorly, there is an enhancing mass. It is round with irregular borders and it measures 21 x 24 x 26 mm. There is a biopsy marker clip within the mass. 1.9 cm lateral/anterior/ cranial to the mass is a seconds  biopsy marker clip. This clip is surrounded by minimal normal parenchymal enhancement and minimal rim enhancement consistent with post biopsy change. There is no mass appreciated in this area. Ultrasound also demonstrated evidence of possible satellite masses immediately inferior to the dominant mass, but none are seen by MRI.  Lymph nodes: Normal bilateral axillary lymph nodes  Ancillary findings:  None.  IMPRESSION: Known invasive carcinoma 8 o'clock position left breast. A potential satellite mass was also biopsied as described above, with no evidence of mass or suspicious enhancement corresponding to the second biopsy site.  RECOMMENDATION: Treatment planning  BI-RADS CATEGORY  6: Known biopsy-proven malignancy.   Electronically Signed   By: RSkipper ClicheM.D.   On: 11/21/2013 14:02   Mm Digital Diagnostic Bilat  11/06/2013   CLINICAL DATA:  63year old patient with palpable mass in the lower inner quadrant of the left breast.  EXAM: DIGITAL DIAGNOSTIC  BILATERAL MAMMOGRAM WITH CAD  ULTRASOUND LEFT BREAST  COMPARISON:  With priors  ACR Breast Density Category c: The breast tissue is heterogeneously dense, which may obscure small masses.  FINDINGS: Focal spot compression view of the region of palpable concern in the lower inner quadrant of the left breast demonstrates an approximately 2.4 cm mass with lobulated margins. The remainder of the left breast appears stable. No distortion or suspicious microcalcification is identified on the left.  No mass, distortion, or suspicious microcalcification is identified in the right breast is does malignancy.  Mammographic images were processed with CAD.  On physical exam, there is a firm fixed mass in the left breast at approximately 8:30 position 5 cm from the nipple.  Ultrasound is performed, showing an irregular hypoechoic solid mass with prominent internal vascularity 8:30 position 5 cm from the nipple that measures 2.1 x 1.7 x 1.8 cm. Approximately 1.7 cm medial to  the medial margin of this dominant mass is an oval circumscribed hypoechoic mass that measures 0.7 x 0.5 x 0.7 cm. Just slightly inferior to the palpable mass are at least 2  hypoechoic nodules, the largest measuring 8 mm and the smaller measuring 4 mm. These are suspicious for satellite tumor nodules.  Ultrasound of the left axilla demonstrates a normal axillary lymph node with prominent fatty hilum. No suspicious lymph nodes are detected.  IMPRESSION: Findings highly suspicious for invasive mammary carcinoma of the left breast at the site of the palpable lump. There are 3 solid nodules in the region of the palpable lump, for which satellite tumor nodules cannot be excluded.  No evidence of malignancy in the right breast.  RECOMMENDATION: Ultrasound-guided biopsy of the dominant palpable mass, and of the nodule most distal (1.7 cm medial to) the dominant mass. Ultrasound-guided biopsies have been scheduled for 11/13/2013 at 8:30 a.m.  I have discussed the findings and recommendations with the patient. Results were also provided in writing at the conclusion of the visit. If applicable, a reminder letter will be sent to the patient regarding the next appointment.  BI-RADS CATEGORY  5: Highly suggestive of malignancy - appropriate action should be taken.   Electronically Signed   By: Curlene Dolphin M.D.   On: 11/06/2013 13:16   Mm Digital Diagnostic Unilat L  11/13/2013   CLINICAL DATA:  Left breast masses  EXAM: POST-BIOPSY CLIP PLACEMENT LEFT DIAGNOSTIC MAMMOGRAM  COMPARISON:  Previous exams.  FINDINGS: Films are performed following ultrasound guided biopsy of left breast masses. Images show a ribbon type clip in association with a dominant, palpable mass in the lower inner quadrant of the right breast, anteriorly an appropriate position (site 1 of 2). Images show a wing type clip in association with a satellite mass in the lower inner quadrant, medial to the dominant mass (site 2 of 2). Clip is also in appropriate  position.  IMPRESSION: Adequate clip placement follow ultrasound-guided left breast biopsies.  Final Assessment: Post Procedure Mammograms for Marker Placement   Electronically Signed   By: Duke Salvia M.D.   On: 11/13/2013 10:41   US Breast Ltd Uni Left Inc Axilla  11/06/2013   CLINICAL DATA:  63 year old patient with palpable mass in the lower inner quadrant of the left breast.  EXAM: DIGITAL DIAGNOSTIC  BILATERAL MAMMOGRAM WITH CAD  ULTRASOUND LEFT BREAST  COMPARISON:  With priors  ACR Breast Density Category c: The breast tissue is heterogeneously dense, which may obscure small masses.  FINDINGS: Focal spot compression view of the region of palpable concern in the lower inner quadrant of the left breast demonstrates an approximately 2.4 cm mass with lobulated margins. The remainder of the left breast appears stable. No distortion or suspicious microcalcification is identified on the left.  No mass, distortion, or suspicious microcalcification is identified in the right breast is does malignancy.  Mammographic images were processed with CAD.  On physical exam, there is a firm fixed mass in the left breast at approximately 8:30 position 5 cm from the nipple.  Ultrasound is performed, showing an irregular hypoechoic solid mass with prominent internal vascularity 8:30 position 5 cm from the nipple that measures 2.1 x 1.7 x 1.8 cm. Approximately 1.7 cm medial to the medial margin of this dominant mass is an oval circumscribed hypoechoic mass that measures 0.7 x 0.5 x 0.7 cm. Just slightly inferior to the palpable mass are at least 2 hypoechoic nodules, the largest measuring 8 mm and the smaller measuring 4 mm. These are suspicious for satellite tumor nodules.  Ultrasound of the left axilla demonstrates a normal axillary lymph node with prominent fatty hilum. No suspicious lymph nodes are detected.  IMPRESSION: Findings highly suspicious for invasive mammary carcinoma of the left breast at the site of the  palpable lump. There are 3 solid nodules in the region of the palpable lump, for which satellite tumor nodules cannot be excluded.  No evidence of malignancy in the right breast.  RECOMMENDATION: Ultrasound-guided biopsy of the dominant palpable mass, and of the nodule most distal (1.7 cm medial to) the dominant mass. Ultrasound-guided biopsies have been scheduled for 11/13/2013 at 8:30 a.m.  I have discussed the findings and recommendations with the patient. Results were also provided in writing at the conclusion of the visit. If applicable, a reminder letter will be sent to the patient regarding the next appointment.  BI-RADS CATEGORY  5: Highly suggestive of malignancy - appropriate action should be taken.   Electronically Signed   By: Susan  Turner M.D.   On: 11/06/2013 13:16   Us Lt Breast Bx W Loc Dev 1st Lesion Img Bx Spec Us Guide  11/14/2013   ADDENDUM REPORT: 11/14/2013 15:07  ADDENDUM: The final pathological diagnosis for the dominant, palpable mass is invasive ductal carcinoma. This is concordant with the imaging findings. The final pathological diagnosis for the smaller, satellite lesion is benign breast tissue with fibrocystic change. This may be concordant with the imaging findings if the hypoechoic mass actually represented a mildly complex cyst. Otherwise, this is not concordant with the imaging findings. This area, and the two small nearby areas seen at ultrasound, can be further evaluated at staging MRI.  The final pathological diagnosis was discussed with the patient by telephone on 11/14/2013. Her questions were answered. She reported no pain, bruising or palpable hematoma at the biopsy sites.  The patient requested an appointment with Dr. Kirston Luty. She was given an appointment with Dr. Maddilynn Esperanza at 11:30 a.m. on 11/25/2013. The patient's schedule did not allow a first available breast MRI on 11/19/2013. Therefore, the patient will contact [facility] at [address] directly to  find a time for the MRI that works with her schedule.   Electronically Signed   By: Steve  Reid M.D.   On: 11/14/2013 15:07   11/14/2013   CLINICAL DATA:  Palpable left breast mass. A satellite lesion noted on ultrasound.  EXAM: ULTRASOUND GUIDED LEFT BREAST CORE NEEDLE BIOPSIES  COMPARISON:  Previous exams.  FINDINGS: I met with the patient and we discussed the procedure of ultrasound-guided biopsy, including benefits and alternatives. We discussed the high likelihood of a successful procedure. We discussed the risks of the procedure, including infection, bleeding, tissue injury, clip migration, and inadequate sampling. Informed written consent was given. The usual time-out protocol was performed immediately prior to the procedure.  Using sterile technique and 2% Lidocaine as local anesthetic, under direct ultrasound visualization, a 14 gauge spring-loaded device was used to perform biopsy of the dominant, palpable mass using an inferior approach (SITE 1 of 2). A ribbon type tissue marker clip was deployed into the biopsy cavity.  Using sterile technique and 2% Lidocaine as local anesthetic, under direct ultrasound visualization, a 14 gauge spring-loaded device was used to perform biopsy of satellite lesion using an inferior approach (SITE 2 of 2). A wing type tissue marker clip was deployed into the biopsy cavity.  Follow up 2 view mammogram was performed and dictated separately.  IMPRESSION: Ultrasound guided biopsy of left breast masses. No apparent complications.  Electronically Signed: By: Kelly  Brozzetti M.D. On: 11/13/2013 10:43   Us Lt Breast Bx W Loc Dev Ea Add Lesion Img   Bx Spec US Guide  11/14/2013   ADDENDUM REPORT: 11/14/2013 15:07  ADDENDUM: The final pathological diagnosis for the dominant, palpable mass is invasive ductal carcinoma. This is concordant with the imaging findings. The final pathological diagnosis for the smaller, satellite lesion is benign breast tissue with fibrocystic change.  This may be concordant with the imaging findings if the hypoechoic mass actually represented a mildly complex cyst. Otherwise, this is not concordant with the imaging findings. This area, and the two small nearby areas seen at ultrasound, can be further evaluated at staging MRI.  The final pathological diagnosis was discussed with the patient by telephone on 11/14/2013. Her questions were answered. She reported no pain, bruising or palpable hematoma at the biopsy sites.  The patient requested an appointment with Dr. Barry Dienes. She was given an appointment with Dr. Barry Dienes at 11:30 a.m. on 11/25/2013. The patient's schedule did not allow a first available breast MRI on 11/19/2013. Therefore, the patient will contact [place] [facility] directly to find a time for the MRI that works with her schedule.   Electronically Signed   By: Enrique Sack M.D.   On: 11/14/2013 15:07   11/14/2013   CLINICAL DATA:  Palpable left breast mass. A satellite lesion noted on ultrasound.  EXAM: ULTRASOUND GUIDED LEFT BREAST CORE NEEDLE BIOPSIES  COMPARISON:  Previous exams.  FINDINGS: I met with the patient and we discussed the procedure of ultrasound-guided biopsy, including benefits and alternatives. We discussed the high likelihood of a successful procedure. We discussed the risks of the procedure, including infection, bleeding, tissue injury, clip migration, and inadequate sampling. Informed written consent was given. The usual time-out protocol was performed immediately prior to the procedure.  Using sterile technique and 2% Lidocaine as local anesthetic, under direct ultrasound visualization, a 14 gauge spring-loaded device was used to perform biopsy of the dominant, palpable mass using an inferior approach (SITE 1 of 2). A ribbon type tissue marker clip was deployed into the biopsy cavity.  Using sterile technique and 2% Lidocaine as local anesthetic, under direct ultrasound visualization, a 14 gauge  spring-loaded device was used to perform biopsy of satellite lesion using an inferior approach (SITE 2 of 2). A wing type tissue marker clip was deployed into the biopsy cavity.  Follow up 2 view mammogram was performed and dictated separately.  IMPRESSION: Ultrasound guided biopsy of left breast masses. No apparent complications.  Electronically Signed: By: Duke Salvia M.D. On: 11/13/2013 10:43      ASSESSMENT AND PLAN: Cancer of lower-inner quadrant of left female breast Pt has a 2.6 cm lesion in the left breast.  This appears to be a cT2N0 tumor.    She desires to have surgery up front and not have neoadjuvant chemotherapy.    She has adequate size breasts for this.  The second lesion that was biopsied was benign.   The surgical procedure was described to the patient.  I discussed the incision type and location and whether we would need radiology involved on the day of surgery with a wire marker and/or sentinel node.      The risks and benefits of the procedure were described to the patient and he/she wishes to proceed.    We discussed the risks bleeding, infection, damage to other structures, need for further procedures/surgeries.  We discussed the risk of seroma.  The patient was advised if the area in the breast in cancer, we may need to go back to surgery for additional  tissue to obtain negative margins or for a lymph node biopsy. The patient was advised that these are the most common complications, but that others can occur as well.  She was advised against taking aspirin or other anti-inflammatory agents/blood thinners the week before surgery.    She would like to have this done soon.    I will make referrals for rad onc, med onc, and genetics.     Darina Hartwell L Zayden Hahne MD Surgical Oncology, General and Endocrine Surgery [facility]      Visit Diagnoses: 1. Breast cancer, left   2. Breast cancer, left breast     Primary Care Physician: Shaw, William,  MD    

## 2013-11-25 NOTE — Assessment & Plan Note (Addendum)
Pt has a 2.6 cm lesion in the left breast.  This appears to be a cT2N0 tumor.    She desires to have surgery up front and not have neoadjuvant chemotherapy.    She has adequate size breasts for this.  The second lesion that was biopsied was benign.   The surgical procedure was described to the patient.  I discussed the incision type and location and whether we would need radiology involved on the day of surgery with a wire marker and/or sentinel node.      The risks and benefits of the procedure were described to the patient and he/she wishes to proceed.    We discussed the risks bleeding, infection, damage to other structures, need for further procedures/surgeries.  We discussed the risk of seroma.  The patient was advised if the area in the breast in cancer, we may need to go back to surgery for additional tissue to obtain negative margins or for a lymph node biopsy. The patient was advised that these are the most common complications, but that others can occur as well.  She was advised against taking aspirin or other anti-inflammatory agents/blood thinners the week before surgery.    She would like to have this done soon.

## 2013-11-26 ENCOUNTER — Other Ambulatory Visit: Payer: Self-pay | Admitting: Obstetrics & Gynecology

## 2013-11-27 ENCOUNTER — Encounter (HOSPITAL_COMMUNITY): Payer: Self-pay | Admitting: Pharmacy Technician

## 2013-11-28 ENCOUNTER — Other Ambulatory Visit (INDEPENDENT_AMBULATORY_CARE_PROVIDER_SITE_OTHER): Payer: Self-pay | Admitting: General Surgery

## 2013-11-28 DIAGNOSIS — C50919 Malignant neoplasm of unspecified site of unspecified female breast: Secondary | ICD-10-CM

## 2013-11-29 ENCOUNTER — Telehealth: Payer: Self-pay | Admitting: *Deleted

## 2013-11-29 NOTE — Telephone Encounter (Signed)
Pt returned my call and I confirmed 01/09/14 genetic appt w/ pt.  Emailed Bernie at [employer] to make her aware.

## 2013-11-29 NOTE — Telephone Encounter (Signed)
Left message for pt to return my call so I can schedule a genetic appt.  

## 2013-12-03 ENCOUNTER — Encounter (HOSPITAL_COMMUNITY): Payer: Self-pay

## 2013-12-03 ENCOUNTER — Encounter (HOSPITAL_COMMUNITY)
Admission: RE | Admit: 2013-12-03 | Discharge: 2013-12-03 | Disposition: A | Discharge status: Home / Self Care | Payer: BC Managed Care – PPO | Source: Ambulatory Visit | Attending: General Surgery | Admitting: General Surgery

## 2013-12-03 ENCOUNTER — Other Ambulatory Visit (HOSPITAL_COMMUNITY): Payer: Self-pay | Admitting: *Deleted

## 2013-12-03 HISTORY — DX: Anxiety disorder, unspecified: F41.9

## 2013-12-03 HISTORY — DX: Personal history of other infectious and parasitic diseases: Z86.19

## 2013-12-03 HISTORY — DX: Malignant (primary) neoplasm, unspecified: C80.1

## 2013-12-03 HISTORY — DX: Pneumonia, unspecified organism: J18.9

## 2013-12-03 HISTORY — DX: Diverticulitis of intestine, part unspecified, without perforation or abscess without bleeding: K57.92

## 2013-12-03 LAB — CBC
HEMATOCRIT: 41.7 % (ref 36.0–46.0)
HEMOGLOBIN: 14.2 g/dL (ref 12.0–15.0)
MCH: 31.6 pg (ref 26.0–34.0)
MCHC: 34.1 g/dL (ref 30.0–36.0)
MCV: 92.7 fL (ref 78.0–100.0)
Platelets: 218 10*3/uL (ref 150–400)
RBC: 4.5 MIL/uL (ref 3.87–5.11)
RDW: 13 % (ref 11.5–15.5)
WBC: 6.7 10*3/uL (ref 4.0–10.5)

## 2013-12-03 LAB — BASIC METABOLIC PANEL
BUN: 12 mg/dL (ref 6–23)
CO2: 25 mEq/L (ref 19–32)
Calcium: 9.8 mg/dL (ref 8.4–10.5)
Chloride: 103 mEq/L (ref 96–112)
Creatinine, Ser: 0.79 mg/dL (ref 0.50–1.10)
GFR calc non Af Amer: 88 mL/min — ABNORMAL LOW (ref >90)
Glucose, Bld: 109 mg/dL — ABNORMAL HIGH (ref 70–99)
POTASSIUM: 4.5 meq/L (ref 3.7–5.3)
Sodium: 141 mEq/L (ref 137–147)

## 2013-12-03 NOTE — Pre-Procedure Instructions (Signed)
Connie [name]  12/03/2013   Your procedure is scheduled on:  Thursday, Dec 05, 2013 at 11:30 AM.   Report to [facility] "A" Admitting Office at 9:30 AM.   Call this number if you have problems the morning of surgery: [phone]   Remember:   Do not eat food or drink liquids after midnight Wednesday, 12/04/13.   Take these medicines the morning of surgery with A SIP OF WATER: atenolol (TENORMIN), omeprazole (PRILOSEC OTC), HYDROcodone-acetaminophen (NORCO) - if needed, fluticasone (FLONASE) - if needed.  Stop Aspirin, Fiorinal, and Vitamins as of today. Please take all your other medicines as you normally do until day of surgery and follow above instructions.    Do not wear jewelry, make-up or nail polish.  Do not wear lotions, powders, or perfumes. You may NOT wear deodorant.  Do not shave 48 hours prior to surgery.  Do not bring valuables to the hospital.  [facility] is not responsible                  for any belongings or valuables.               Contacts, dentures or bridgework may not be worn into surgery.  Leave suitcase in the car. After surgery it may be brought to your room.  For patients admitted to the hospital, discharge time is determined by your                treatment team.               Patients discharged the day of surgery will not be allowed to drive home.  Name and phone number of your driver: Family/friend   Special Instructions: [facility] - Preparing for Surgery  Before surgery, you can play an important role.  Because skin is not sterile, your skin needs to be as free of germs as possible.  You can reduce the number of germs on you skin by washing with CHG (chlorahexidine gluconate) soap before surgery.  CHG is an antiseptic cleaner which kills germs and bonds with the skin to continue killing germs even after washing.  Please DO NOT use if you have an allergy to CHG or antibacterial soaps.  If your skin becomes reddened/irritated stop  using the CHG and inform your nurse when you arrive at Short Stay.  Do not shave (including legs and underarms) for at least 48 hours prior to the first CHG shower.  You may shave your face.  Please follow these instructions carefully:   1.  Shower with CHG Soap the night before surgery and the                                morning of Surgery.  2.  If you choose to wash your hair, wash your hair first as usual with your       normal shampoo.  3.  After you shampoo, rinse your hair and body thoroughly to remove the                      Shampoo.  4.  Use CHG as you would any other liquid soap.  You can apply chg directly       to the skin and wash gently with scrungie or a clean washcloth.  5.  Apply the CHG Soap to your body ONLY FROM THE NECK DOWN.  Do not use on open wounds or open sores.  Avoid contact with your eyes, ears, mouth and genitals (private parts).  Wash genitals (private parts) with your normal soap.  6.  Wash thoroughly, paying special attention to the area where your surgery        will be performed.  7.  Thoroughly rinse your body with warm water from the neck down.  8.  DO NOT shower/wash with your normal soap after using and rinsing off       the CHG Soap.  9.  Pat yourself dry with a clean towel.            10.  Wear clean pajamas.            11.  Place clean sheets on your bed the night of your first shower and do not        sleep with pets.  Day of Surgery  Do not apply any lotions/deodorants the morning of surgery.  Please wear clean clothes to the hospital/surgery center.     Please read over the following fact sheets that you were given: Pain Booklet, Coughing and Deep Breathing and Surgical Site Infection Prevention

## 2013-12-04 MED ORDER — CEFAZOLIN SODIUM-DEXTROSE 2-3 GM-% IV SOLR
2.0000 g | INTRAVENOUS | Status: AC
  Administered 2013-12-05: 2 g via INTRAVENOUS
  Filled 2013-12-04: qty 50

## 2013-12-05 ENCOUNTER — Encounter (HOSPITAL_COMMUNITY): Payer: BC Managed Care – PPO | Admitting: Certified Registered Nurse Anesthetist

## 2013-12-05 ENCOUNTER — Encounter (HOSPITAL_COMMUNITY): Payer: Self-pay | Admitting: *Deleted

## 2013-12-05 ENCOUNTER — Encounter (HOSPITAL_COMMUNITY)
Admission: RE | Disposition: A | Discharge status: Home / Self Care | Payer: Self-pay | Source: Ambulatory Visit | Attending: General Surgery

## 2013-12-05 ENCOUNTER — Ambulatory Visit (HOSPITAL_COMMUNITY): Payer: BC Managed Care – PPO | Admitting: Certified Registered Nurse Anesthetist

## 2013-12-05 ENCOUNTER — Ambulatory Visit (HOSPITAL_COMMUNITY)
Admission: RE | Admit: 2013-12-05 | Discharge: 2013-12-05 | Disposition: A | Discharge status: Home / Self Care | Payer: BC Managed Care – PPO | Source: Ambulatory Visit | Attending: General Surgery | Admitting: General Surgery

## 2013-12-05 DIAGNOSIS — C50912 Malignant neoplasm of unspecified site of left female breast: Secondary | ICD-10-CM

## 2013-12-05 DIAGNOSIS — J42 Unspecified chronic bronchitis: Secondary | ICD-10-CM | POA: Insufficient documentation

## 2013-12-05 DIAGNOSIS — D0590 Unspecified type of carcinoma in situ of unspecified breast: Secondary | ICD-10-CM

## 2013-12-05 DIAGNOSIS — G4733 Obstructive sleep apnea (adult) (pediatric): Secondary | ICD-10-CM | POA: Insufficient documentation

## 2013-12-05 DIAGNOSIS — Z79899 Other long term (current) drug therapy: Secondary | ICD-10-CM | POA: Insufficient documentation

## 2013-12-05 DIAGNOSIS — F3289 Other specified depressive episodes: Secondary | ICD-10-CM | POA: Insufficient documentation

## 2013-12-05 DIAGNOSIS — F329 Major depressive disorder, single episode, unspecified: Secondary | ICD-10-CM | POA: Insufficient documentation

## 2013-12-05 DIAGNOSIS — Z170 Estrogen receptor positive status [ER+]: Secondary | ICD-10-CM | POA: Insufficient documentation

## 2013-12-05 DIAGNOSIS — Z01812 Encounter for preprocedural laboratory examination: Secondary | ICD-10-CM | POA: Insufficient documentation

## 2013-12-05 DIAGNOSIS — K589 Irritable bowel syndrome without diarrhea: Secondary | ICD-10-CM | POA: Insufficient documentation

## 2013-12-05 DIAGNOSIS — E7800 Pure hypercholesterolemia, unspecified: Secondary | ICD-10-CM | POA: Insufficient documentation

## 2013-12-05 DIAGNOSIS — I10 Essential (primary) hypertension: Secondary | ICD-10-CM | POA: Insufficient documentation

## 2013-12-05 DIAGNOSIS — C50319 Malignant neoplasm of lower-inner quadrant of unspecified female breast: Secondary | ICD-10-CM | POA: Insufficient documentation

## 2013-12-05 DIAGNOSIS — Z803 Family history of malignant neoplasm of breast: Secondary | ICD-10-CM | POA: Insufficient documentation

## 2013-12-05 DIAGNOSIS — K219 Gastro-esophageal reflux disease without esophagitis: Secondary | ICD-10-CM | POA: Insufficient documentation

## 2013-12-05 DIAGNOSIS — G43909 Migraine, unspecified, not intractable, without status migrainosus: Secondary | ICD-10-CM | POA: Insufficient documentation

## 2013-12-05 HISTORY — PX: BREAST LUMPECTOMY: SHX2

## 2013-12-05 SURGERY — BREAST LUMPECTOMY WITH EXCISION OF SENTINEL NODE
Anesthesia: General | Site: Breast | Laterality: Left

## 2013-12-05 MED ORDER — DEXAMETHASONE SODIUM PHOSPHATE 4 MG/ML IJ SOLN
INTRAMUSCULAR | Status: DC | PRN
  Administered 2013-12-05: 4 mg via INTRAVENOUS

## 2013-12-05 MED ORDER — MIDAZOLAM HCL 5 MG/5ML IJ SOLN
INTRAMUSCULAR | Status: DC | PRN
  Administered 2013-12-05 (×2): 1 mg via INTRAVENOUS

## 2013-12-05 MED ORDER — EPHEDRINE SULFATE 50 MG/ML IJ SOLN
INTRAMUSCULAR | Status: AC
  Filled 2013-12-05: qty 1

## 2013-12-05 MED ORDER — FENTANYL CITRATE 0.05 MG/ML IJ SOLN
INTRAMUSCULAR | Status: AC
  Administered 2013-12-05: 100 ug via INTRAVENOUS
  Filled 2013-12-05: qty 2

## 2013-12-05 MED ORDER — LIDOCAINE HCL 1 % IJ SOLN
INTRAMUSCULAR | Status: DC | PRN
  Administered 2013-12-05: 13:00:00

## 2013-12-05 MED ORDER — FENTANYL CITRATE 0.05 MG/ML IJ SOLN
INTRAMUSCULAR | Status: DC | PRN
  Administered 2013-12-05 (×3): 50 ug via INTRAVENOUS
  Administered 2013-12-05 (×4): 25 ug via INTRAVENOUS

## 2013-12-05 MED ORDER — PROMETHAZINE HCL 25 MG/ML IJ SOLN: 6.2500 mg | INTRAMUSCULAR | Status: DC | PRN

## 2013-12-05 MED ORDER — PROPOFOL 10 MG/ML IV BOLUS
INTRAVENOUS | Status: DC | PRN
  Administered 2013-12-05: 100 mg via INTRAVENOUS

## 2013-12-05 MED ORDER — TECHNETIUM TC 99M SULFUR COLLOID FILTERED
1.0000 | Freq: Once | INTRAVENOUS | Status: AC | PRN
  Administered 2013-12-05: 1 via INTRADERMAL

## 2013-12-05 MED ORDER — MIDAZOLAM HCL 2 MG/2ML IJ SOLN: 0.5000 mg | Freq: Once | INTRAMUSCULAR | Status: DC | PRN

## 2013-12-05 MED ORDER — MIDAZOLAM HCL 2 MG/2ML IJ SOLN
INTRAMUSCULAR | Status: AC
  Administered 2013-12-05: 2 mg via INTRAVENOUS
  Filled 2013-12-05: qty 2

## 2013-12-05 MED ORDER — FENTANYL CITRATE 0.05 MG/ML IJ SOLN
INTRAMUSCULAR | Status: AC
  Filled 2013-12-05: qty 5

## 2013-12-05 MED ORDER — LACTATED RINGERS IV SOLN
INTRAVENOUS | Status: DC
  Administered 2013-12-05 (×2): via INTRAVENOUS

## 2013-12-05 MED ORDER — DEXAMETHASONE SODIUM PHOSPHATE 4 MG/ML IJ SOLN
INTRAMUSCULAR | Status: AC
  Filled 2013-12-05: qty 1

## 2013-12-05 MED ORDER — OXYCODONE HCL 5 MG PO TABS: 5.0000 mg | ORAL_TABLET | ORAL | Status: DC | PRN

## 2013-12-05 MED ORDER — LIDOCAINE HCL (CARDIAC) 20 MG/ML IV SOLN
INTRAVENOUS | Status: DC | PRN
  Administered 2013-12-05: 50 mg via INTRAVENOUS

## 2013-12-05 MED ORDER — HYDROCODONE-ACETAMINOPHEN 5-325 MG PO TABS: 1.0000 | ORAL_TABLET | ORAL | Status: DC | PRN

## 2013-12-05 MED ORDER — STERILE WATER FOR INJECTION IJ SOLN
INTRAMUSCULAR | Status: AC
  Filled 2013-12-05: qty 10

## 2013-12-05 MED ORDER — LIDOCAINE HCL (CARDIAC) 20 MG/ML IV SOLN
INTRAVENOUS | Status: AC
  Filled 2013-12-05: qty 5

## 2013-12-05 MED ORDER — ONDANSETRON HCL 4 MG/2ML IJ SOLN
INTRAMUSCULAR | Status: AC
  Filled 2013-12-05: qty 2

## 2013-12-05 MED ORDER — EPHEDRINE SULFATE 50 MG/ML IJ SOLN
INTRAMUSCULAR | Status: DC | PRN
  Administered 2013-12-05: 10 mg via INTRAVENOUS

## 2013-12-05 MED ORDER — HYDROMORPHONE HCL PF 1 MG/ML IJ SOLN
INTRAMUSCULAR | Status: AC
  Administered 2013-12-05: 0.5 mg via INTRAVENOUS
  Filled 2013-12-05: qty 1

## 2013-12-05 MED ORDER — ONDANSETRON HCL 4 MG/2ML IJ SOLN
INTRAMUSCULAR | Status: DC | PRN
  Administered 2013-12-05 (×2): 4 mg via INTRAVENOUS

## 2013-12-05 MED ORDER — 0.9 % SODIUM CHLORIDE (POUR BTL) OPTIME
TOPICAL | Status: DC | PRN
  Administered 2013-12-05: 1000 mL

## 2013-12-05 MED ORDER — MIDAZOLAM HCL 2 MG/2ML IJ SOLN
INTRAMUSCULAR | Status: AC
  Filled 2013-12-05: qty 2

## 2013-12-05 MED ORDER — OXYCODONE HCL 5 MG PO TABS
5.0000 mg | ORAL_TABLET | Freq: Once | ORAL | Status: AC | PRN
  Administered 2013-12-05: 5 mg via ORAL

## 2013-12-05 MED ORDER — OXYCODONE HCL 5 MG/5ML PO SOLN: 5.0000 mg | Freq: Once | ORAL | Status: AC | PRN

## 2013-12-05 MED ORDER — OXYCODONE HCL 5 MG PO TABS
ORAL_TABLET | ORAL | Status: AC
  Filled 2013-12-05: qty 1

## 2013-12-05 MED ORDER — ARTIFICIAL TEARS OP OINT
TOPICAL_OINTMENT | OPHTHALMIC | Status: AC
  Filled 2013-12-05: qty 3.5

## 2013-12-05 MED ORDER — HYDROMORPHONE HCL PF 1 MG/ML IJ SOLN
0.2500 mg | INTRAMUSCULAR | Status: DC | PRN
  Administered 2013-12-05: 0.25 mg via INTRAVENOUS
  Administered 2013-12-05: 0.5 mg via INTRAVENOUS
  Administered 2013-12-05: 0.25 mg via INTRAVENOUS

## 2013-12-05 MED ORDER — PROPOFOL 10 MG/ML IV BOLUS
INTRAVENOUS | Status: AC
  Filled 2013-12-05: qty 20

## 2013-12-05 MED ORDER — MEPERIDINE HCL 25 MG/ML IJ SOLN: 6.2500 mg | INTRAMUSCULAR | Status: DC | PRN

## 2013-12-05 MED ORDER — ROCURONIUM BROMIDE 50 MG/5ML IV SOLN
INTRAVENOUS | Status: AC
  Filled 2013-12-05: qty 1

## 2013-12-05 SURGICAL SUPPLY — 60 items
BINDER BREAST LRG (GAUZE/BANDAGES/DRESSINGS) IMPLANT
BINDER BREAST MEDIUM (GAUZE/BANDAGES/DRESSINGS) IMPLANT
BINDER BREAST XLRG (GAUZE/BANDAGES/DRESSINGS) ×3 IMPLANT
BINDER BREAST XXLRG (GAUZE/BANDAGES/DRESSINGS) IMPLANT
BNDG COHESIVE 4X5 TAN STRL (GAUZE/BANDAGES/DRESSINGS) ×3 IMPLANT
CANISTER SUCTION 2500CC (MISCELLANEOUS) ×3 IMPLANT
CHLORAPREP W/TINT 26ML (MISCELLANEOUS) ×3 IMPLANT
CLIP TI MEDIUM 24 (CLIP) ×3 IMPLANT
CLIP TI MEDIUM LARGE 6 (CLIP) ×3 IMPLANT
CLIP TI WIDE RED SMALL 24 (CLIP) ×3 IMPLANT
CLOSURE WOUND 1/2 X4 (GAUZE/BANDAGES/DRESSINGS) ×2
CONT SPEC 4OZ CLIKSEAL STRL BL (MISCELLANEOUS) ×12 IMPLANT
COVER PROBE W GEL 5X96 (DRAPES) ×3 IMPLANT
COVER SURGICAL LIGHT HANDLE (MISCELLANEOUS) ×3 IMPLANT
DECANTER SPIKE VIAL GLASS SM (MISCELLANEOUS) ×3 IMPLANT
DEVICE DUBIN SPECIMEN MAMMOGRA (MISCELLANEOUS) IMPLANT
DRAPE PROXIMA HALF (DRAPES) ×3 IMPLANT
DRAPE UTILITY 15X26 W/TAPE STR (DRAPE) ×6 IMPLANT
DRSG PAD ABDOMINAL 8X10 ST (GAUZE/BANDAGES/DRESSINGS) ×6 IMPLANT
ELECT CAUTERY BLADE 6.4 (BLADE) ×3 IMPLANT
ELECT REM PT RETURN 9FT ADLT (ELECTROSURGICAL) ×3
ELECTRODE REM PT RTRN 9FT ADLT (ELECTROSURGICAL) ×1 IMPLANT
GLOVE BIO SURGEON STRL SZ 6 (GLOVE) ×3 IMPLANT
GLOVE BIO SURGEON STRL SZ7.5 (GLOVE) ×3 IMPLANT
GLOVE BIOGEL PI IND STRL 6.5 (GLOVE) ×1 IMPLANT
GLOVE BIOGEL PI IND STRL 7.0 (GLOVE) ×2 IMPLANT
GLOVE BIOGEL PI IND STRL 7.5 (GLOVE) ×1 IMPLANT
GLOVE BIOGEL PI INDICATOR 6.5 (GLOVE) ×2
GLOVE BIOGEL PI INDICATOR 7.0 (GLOVE) ×4
GLOVE BIOGEL PI INDICATOR 7.5 (GLOVE) ×2
GLOVE SS BIOGEL STRL SZ 7 (GLOVE) ×1 IMPLANT
GLOVE SUPERSENSE BIOGEL SZ 7 (GLOVE) ×2
GOWN STRL REUS W/ TWL LRG LVL3 (GOWN DISPOSABLE) ×2 IMPLANT
GOWN STRL REUS W/TWL 2XL LVL3 (GOWN DISPOSABLE) ×6 IMPLANT
GOWN STRL REUS W/TWL LRG LVL3 (GOWN DISPOSABLE) ×4
KIT BASIN OR (CUSTOM PROCEDURE TRAY) ×3 IMPLANT
KIT MARKER MARGIN INK (KITS) ×3 IMPLANT
KIT ROOM TURNOVER OR (KITS) ×3 IMPLANT
NEEDLE 18GX1X1/2 (RX/OR ONLY) (NEEDLE) ×3 IMPLANT
NEEDLE HYPO 25GX1X1/2 BEV (NEEDLE) ×6 IMPLANT
NS IRRIG 1000ML POUR BTL (IV SOLUTION) ×3 IMPLANT
PACK GENERAL/GYN (CUSTOM PROCEDURE TRAY) ×3 IMPLANT
PACK UNIVERSAL I (CUSTOM PROCEDURE TRAY) ×3 IMPLANT
PAD ARMBOARD 7.5X6 YLW CONV (MISCELLANEOUS) ×6 IMPLANT
SPECIMEN JAR LARGE (MISCELLANEOUS) ×3 IMPLANT
SPONGE GAUZE 4X4 12PLY (GAUZE/BANDAGES/DRESSINGS) ×3 IMPLANT
STAPLER VISISTAT 35W (STAPLE) ×3 IMPLANT
STOCKINETTE IMPERVIOUS 9X36 MD (GAUZE/BANDAGES/DRESSINGS) ×3 IMPLANT
STRIP CLOSURE SKIN 1/2X4 (GAUZE/BANDAGES/DRESSINGS) ×4 IMPLANT
SUT MNCRL AB 4-0 PS2 18 (SUTURE) ×6 IMPLANT
SUT SILK 2 0 SH (SUTURE) ×3 IMPLANT
SUT VIC AB 2-0 SH 27 (SUTURE) ×2
SUT VIC AB 2-0 SH 27X BRD (SUTURE) ×1 IMPLANT
SUT VIC AB 3-0 SH 27 (SUTURE) ×4
SUT VIC AB 3-0 SH 27X BRD (SUTURE) ×2 IMPLANT
SYR CONTROL 10ML LL (SYRINGE) ×6 IMPLANT
TOWEL OR 17X24 6PK STRL BLUE (TOWEL DISPOSABLE) ×3 IMPLANT
TOWEL OR 17X26 10 PK STRL BLUE (TOWEL DISPOSABLE) ×3 IMPLANT
TOWEL OR NON WOVEN STRL DISP B (DISPOSABLE) ×3 IMPLANT
WATER STERILE IRR 1000ML POUR (IV SOLUTION) IMPLANT

## 2013-12-05 NOTE — Op Note (Signed)
Left Breast partial mastectomy with Sentinel Node Biopsy   Indications: This patient presents with history of left breast cancer with clinically negative axillary lymph node exam.  Pre-operative Diagnosis: left breast cancer  Post-operative Diagnosis: left breast cancer  Surgeon: [name]   Anesthesia: General LMA anesthesia  ASA Class: 2  Procedure Details  The patient was seen in the Holding Room. The risks, benefits, complications, treatment options, and expected outcomes were discussed with the patient. The possibilities of reaction to medication, pulmonary aspiration, bleeding, infection, the need for additional procedures, failure to diagnose a condition, and creating a complication requiring transfusion or operation were discussed with the patient. The patient concurred with the proposed plan, giving informed consent.  The site of surgery properly noted/marked. The patient was taken to Operating Room # 2, identified as Connie [name] and the procedure verified as Breast Lumpectomy and Sentinel Node Biopsy. A Time Out was held and the above information confirmed.  After induction of anesthesia, the left arm, breast, and chest were prepped and draped in standard fashion.  The lumpectomy was performed by creating an oblique incision over the lower inner quadrant of the breast.  Dissection was carried down around the palpable mass.  The specimen was inked.    Hemostasis was achieved with cautery.  Additional tissue was taken along the inferior margin and submitted separately to pathology after providing orientation for the pathology.  The wound was irrigated and reexamined for hemostasis.  It was then closed with a 3-0 Vicryl deep dermal interrupted and a 4-0 Monocryl subcuticular closure in layers.    Using a hand-held gamma probe, axillary sentinel nodes were identified transcutaneously.  An oblique incision was created below the axillary hairline.  Dissection was carried through the  clavipectoral fascia.  3 level 2 axillary sentinel nodes were removed and submitted to pathology.  Hemostasis was achieved.  The axillary incision was closed with 3-0 Vicryl deep dermal interrupted suture and 4-0 monocryl subcuticular closure in layers.    Sterile dressings were applied. At the end of the operation, all sponge, instrument, and needle counts were correct.  Findings: grossly clear surgical margins and large axillary SLN.  #1 cps 43; #2 cps 291, #3 palpable  Estimated Blood Loss:  Minimal    Specimens: left breast partial mastectomy, additional inferior margin, 3 SLNs                  Complications:  None; patient tolerated the procedure well.         Disposition: PACU - hemodynamically stable.         Condition: stable

## 2013-12-05 NOTE — Transfer of Care (Signed)
Immediate Anesthesia Transfer of Care Note  Patient: Connie [name]  Procedure(s) Performed: Procedure(s): BREAST LUMPECTOMY WITH EXCISION OF SENTINEL NODE (Left)  Patient Location: PACU  Anesthesia Type:General  Level of Consciousness: awake, alert  and oriented  Airway & Oxygen Therapy: Patient Spontanous Breathing and Patient connected to nasal cannula oxygen  Post-op Assessment: Report given to PACU RN and Post -op Vital signs reviewed and stable  Post vital signs: Reviewed and stable  Complications: No apparent anesthesia complications

## 2013-12-05 NOTE — Discharge Instructions (Signed)
[facility] °Office Phone Number [phone] ° °BREAST BIOPSY/ PARTIAL MASTECTOMY: POST OP INSTRUCTIONS ° °Always review your discharge instruction sheet given to you by the facility where your surgery was performed. ° °IF YOU HAVE DISABILITY OR FAMILY LEAVE FORMS, YOU MUST BRING THEM TO THE OFFICE FOR PROCESSING.  DO NOT GIVE THEM TO YOUR DOCTOR. ° °1. A prescription for pain medication may be given to you upon discharge.  Take your pain medication as prescribed, if needed.  If narcotic pain medicine is not needed, then you may take acetaminophen (Tylenol) or ibuprofen (Advil) as needed. °2. Take your usually prescribed medications unless otherwise directed °3. If you need a refill on your pain medication, please contact your pharmacy.  They will contact our office to request authorization.  Prescriptions will not be filled after 5pm or on week-ends. °4. You should eat very light the first 24 hours after surgery, such as soup, crackers, pudding, etc.  Resume your normal diet the day after surgery. °5. Most patients will experience some swelling and bruising in the breast.  Ice packs and a good support bra will help.  Swelling and bruising can take several days to resolve.  °6. It is common to experience some constipation if taking pain medication after surgery.  Increasing fluid intake and taking a stool softener will usually help or prevent this problem from occurring.  A mild laxative (Milk of Magnesia or Miralax) should be taken according to package directions if there are no bowel movements after 48 hours. °7. Unless discharge instructions indicate otherwise, you may remove your bandages 48 hours after surgery, and you may shower at that time.  You may have steri-strips (small skin tapes) in place directly over the incision.  These strips should be left on the skin for 7-10 days.   Any sutures or staples will be removed at the office during your follow-up visit. °8. ACTIVITIES:  You may resume  regular daily activities (gradually increasing) beginning the next day.  Wearing a good support bra or sports bra (or the breast binder) minimizes pain and swelling.  You may have sexual intercourse when it is comfortable. °a. You may drive when you no longer are taking prescription pain medication, you can comfortably wear a seatbelt, and you can safely maneuver your car and apply brakes. °b. RETURN TO WORK:  __________1 week_______________ °9. You should see your doctor in the office for a follow-up appointment approximately two weeks after your surgery.  Your doctor’s nurse will typically make your follow-up appointment when she calls you with your pathology report.  Expect your pathology report 2-3 business days after your surgery.  You may call to check if you do not hear from us after three days. ° ° °WHEN TO CALL YOUR DOCTOR: °1. Fever over 101.0 °2. Nausea and/or vomiting. °3. Extreme swelling or bruising. °4. Continued bleeding from incision. °5. Increased pain, redness, or drainage from the incision. ° °The clinic staff is available to answer your questions during regular business hours.  Please don’t hesitate to call and ask to speak to one of the nurses for clinical concerns.  If you have a medical emergency, go to the nearest emergency room or call 911.  A surgeon from [facility] is always on call at the hospital. ° °For further questions, please visit [email or web address]  ° °

## 2013-12-05 NOTE — Interval H&P Note (Signed)
History and Physical Interval Note:  12/05/2013 10:59 AM  Connie [name]  has presented today for surgery, with the diagnosis of left breast cancer  The various methods of treatment have been discussed with the patient and family. After consideration of risks, benefits and other options for treatment, the patient has consented to  Procedure(s): BREAST LUMPECTOMY WITH EXCISION OF SENTINEL NODE (Left) as a surgical intervention .  The patient's history has been reviewed, patient examined, no change in status, stable for surgery.  I have reviewed the patient's chart and labs.  Questions were answered to the patient's satisfaction.     [name]

## 2013-12-05 NOTE — Anesthesia Preprocedure Evaluation (Addendum)
Anesthesia Evaluation  Patient identified by MRN, date of birth, ID band Patient awake    Reviewed: Allergy & Precautions, H&P , NPO status , Patient's Chart, lab work & pertinent test results, reviewed documented beta blocker date and time   History of Anesthesia Complications Negative for: history of anesthetic complications  Airway Mallampati: II TM Distance: >3 FB Neck ROM: Full    Dental  (+) Teeth Intact, Dental Advisory Given   Pulmonary sleep apnea and Continuous Positive Airway Pressure Ventilation ,  breath sounds clear to auscultation  Pulmonary exam normal       Cardiovascular hypertension, Pt. on medications and Pt. on home beta blockers - anginaRhythm:Regular Rate:Normal  Pt reports normal cath in past   Neuro/Psych    GI/Hepatic Neg liver ROS, GERD-  Medicated and Controlled,  Endo/Other  Morbid obesity  Renal/GU negative Renal ROS     Musculoskeletal   Abdominal (+) + obese,   Peds  Hematology negative hematology ROS (+)   Anesthesia Other Findings Breast lesion  Reproductive/Obstetrics                          Anesthesia Physical Anesthesia Plan  ASA: II  Anesthesia Plan: General   Post-op Pain Management:    Induction: Intravenous  Airway Management Planned: LMA  Additional Equipment:   Intra-op Plan:   Post-operative Plan:   Informed Consent: I have reviewed the patients History and Physical, chart, labs and discussed the procedure including the risks, benefits and alternatives for the proposed anesthesia with the patient or authorized representative who has indicated his/her understanding and acceptance.   Dental advisory given  Plan Discussed with: CRNA and Surgeon  Anesthesia Plan Comments: (Plan routine monitors, GA- LMA OK)        Anesthesia Quick Evaluation

## 2013-12-05 NOTE — Anesthesia Postprocedure Evaluation (Signed)
  Anesthesia Post-op Note  Patient: Connie [name]  Procedure(s) Performed: Procedure(s): BREAST LUMPECTOMY WITH EXCISION OF SENTINEL NODE (Left)  Patient Location: PACU  Anesthesia Type:General  Level of Consciousness: awake, alert , oriented and patient cooperative  Airway and Oxygen Therapy: Patient Spontanous Breathing and Patient connected to nasal cannula oxygen  Post-op Pain: mild  Post-op Assessment: Post-op Vital signs reviewed, Patient's Cardiovascular Status Stable, Respiratory Function Stable, Patent Airway, No signs of Nausea or vomiting and Pain level controlled  Post-op Vital Signs: Reviewed and stable  Last Vitals:  Filed Vitals:   12/05/13 1400  BP: 115/63  Pulse: 64  Temp:   Resp: 10    Complications: No apparent anesthesia complications

## 2013-12-05 NOTE — Anesthesia Procedure Notes (Signed)
Procedure Name: LMA Insertion Date/Time: 12/05/2013 11:34 AM Performed by: [place] Pink Pre-anesthesia Checklist: Patient identified, Timeout performed, Emergency Drugs available, Suction available and Patient being monitored Patient Re-evaluated:Patient Re-evaluated prior to inductionOxygen Delivery Method: Circle system utilized Preoxygenation: Pre-oxygenation with 100% oxygen Intubation Type: IV induction LMA: LMA inserted LMA Size: 4.0 Number of attempts: 1 Placement Confirmation: positive ETCO2 and breath sounds checked- equal and bilateral Tube secured with: Tape Dental Injury: Teeth and Oropharynx as per pre-operative assessment

## 2013-12-05 NOTE — Progress Notes (Signed)
Report to [facility] Therapist, sports (lunch relief).

## 2013-12-05 NOTE — H&P (View-Only) (Signed)
Chief Complaint  Patient presents with  . New Evaluation    eval lft breast CA    HISTORY: Pt is a 63 yo F who palpated a breast mass after christmas this year.  She has previously always had negative mammograms, and she was having knee surgery, so she did not get it checked out immediately.  She subsequently underwent mammogram and ultrasound which showed two masses, one near the other.  The biopsy was positive for invasive ductal carcinoma,  ER/PR +, Her 2 not overexpressed for the main mass.  The satellite nodule was benign.  She subsequently underwent MRI which did not have other findings.  She thinks the mass may have gotten larger in the last month.  No enlarged/suspicious LN were seen on imaging.   She does have an aunt with breast cancer who was BRCA positive, and two cousins (affected aunt's daughters) who had breast and/or ovarian cancer.   She denies breast pain.    Past Medical History  Diagnosis Date  . IBS (irritable bowel syndrome)   . GERD (gastroesophageal reflux disease)   . High cholesterol   . Depression   . PLMD (periodic limb movement disorder) 04/30/2013  . PVC's (premature ventricular contractions) 04/30/2013  . Bronchitis, chronic   . Headache(784.0)     MIGRAINES  . Meniscus tear     LEFT  . Arthritis     L FOOT  . OSA (obstructive sleep apnea) 04/30/2013    USES C -PAP    Past Surgical History  Procedure Laterality Date  . Knee surgery Left 2000-2001     RT KNEE X1  . Appendectomy  1984  . Osteotomy      RT KNEE  . Knee arthroscopy Left 08/28/2013    Procedure: LEFT KNEE ARTHROSCOPY WITH MEDIAL AND LATERAL DEBRIDEMENT;  Surgeon: Gearlean Alf, MD;  Location: [facility];  Service: Orthopedics;  Laterality: Left;    Current Outpatient Prescriptions  Medication Sig Dispense Refill  . aspirin 81 MG tablet Take 81 mg by mouth daily.      Marland Kitchen atenolol (TENORMIN) 25 MG tablet Take 25 mg by mouth every morning.       Marland Kitchen atorvastatin (LIPITOR) 40 MG tablet Take  40 mg by mouth every evening.       Marland Kitchen b complex vitamins tablet Take 1 tablet by mouth daily.      . butalbital-aspirin-caffeine (FIORINAL) 50-325-40 MG per capsule Take 1-2 capsules by mouth 2 (two) times daily as needed for headache.       . cholecalciferol (VITAMIN D) 1000 UNITS tablet Take 1,000 Units by mouth daily.      . cyclobenzaprine (FLEXERIL) 10 MG tablet Take 10 mg by mouth daily as needed for muscle spasms.      Marland Kitchen estradiol (ESTRACE) 0.5 MG tablet Take 0.5 mg by mouth daily.       . fluticasone (FLONASE) 50 MCG/ACT nasal spray       . glucosamine-chondroitin 500-400 MG tablet Take 1 tablet by mouth 2 (two) times daily.      Marland Kitchen HYDROcodone-acetaminophen (NORCO) 5-325 MG per tablet Take 1-2 tablets by mouth every 6 (six) hours as needed for moderate pain.  50 tablet  0  . magnesium oxide (MAG-OX) 400 MG tablet Take 400 mg by mouth daily.      . medroxyPROGESTERone (PROVERA) 2.5 MG tablet Take 2.5 mg by mouth daily.       . Multiple Vitamin (MULTIVITAMIN) tablet Take 1 tablet by mouth daily.      Marland Kitchen  omega-3 acid ethyl esters (LOVAZA) 1 G capsule Take 1 g by mouth 2 (two) times daily.      Marland Kitchen omeprazole (PRILOSEC OTC) 20 MG tablet Take 20 mg by mouth daily.      . sertraline (ZOLOFT) 100 MG tablet Take 100 mg by mouth every evening.       . Triamcinolone Acetonide (NASACORT AQ NA) Place 1 spray into the nose every evening.      . valACYclovir (VALTREX) 1000 MG tablet        No current facility-administered medications for this visit.     No Known Allergies   Family History  Problem Relation Age of Onset  . Cancer Father      History   Social History  . Marital Status: Married    Spouse Name: N/A    Number of Children: 4  . Years of Education: college   Occupational History  . [employer]    Social History Main Topics  . Smoking status: Never Smoker   . Smokeless tobacco: None  . Alcohol Use: Yes     Comment: SOCIALLY  . Drug Use: No  . Sexual Activity: None    Other Topics Concern  . None   Social History Narrative  . None     REVIEW OF SYSTEMS - PERTINENT POSITIVES ONLY: 12 point review of systems negative other than HPI and PMH except for nasal congestion, headaches, and left knee pain.    EXAMDanley Danker Vitals:   11/25/13 1218  BP: 126/84  Pulse: 80  Temp: 97.7 F (36.5 C)  Resp: 14    Wt Readings from Last 3 Encounters:  11/25/13 201 lb (91.173 kg)  08/22/13 199 lb 4 oz (90.379 kg)  06/10/13 195 lb (88.451 kg)     Gen:  No acute distress.  Well nourished and well groomed.   Neurological: Alert and oriented to person, place, and time. Coordination normal.  Head: Normocephalic and atraumatic.  Eyes: Conjunctivae are normal. Pupils are equal, round, and reactive to light. No scleral icterus.  Neck: Normal range of motion. Neck supple. No tracheal deviation or thyromegaly present.  Cardiovascular: Normal rate, regular rhythm, normal heart sounds and intact distal pulses.  Exam reveals no gallop and no friction rub.  No murmur heard. Breast: palpable mass at 8:30 left breast.  Around 3 cm clinically.  Skin appears not to be involved.  There is left nipple retraction.  There is no LAD on either side.  The right side is without positive findings.  Breasts are dense bilaterally.   Respiratory: Effort normal.  No respiratory distress. No chest wall tenderness. Breath sounds normal.  No wheezes, rales or rhonchi.  GI: Soft. Bowel sounds are normal. The abdomen is soft and nontender.  There is no rebound and no guarding.  Musculoskeletal: Normal range of motion. Extremities are nontender.  Lymphadenopathy: No cervical, preauricular, postauricular or axillary adenopathy is present Skin: Skin is warm and dry. No rash noted. No diaphoresis. No erythema. No pallor. No clubbing, cyanosis, or edema.   Psychiatric: Normal mood and affect. Behavior is normal. Judgment and thought content normal.    LABORATORY RESULTS: Available labs are  reviewed   Pathology:  Diagnosis 1. Breast, left, needle core biopsy, mass, 8:30, 5 cm - INVASIVE DUCTAL CARCINOMA, SEE COMMENT. 2. Breast, left, needle core biopsy, mass, 8:30, 5 cm, medial to dom mass - BENIGN BREAST TISSUE, SEE COMMENT. - NEGATIVE FOR ATYPIA OR MALIGNANCY. ER/PR +, Her 2 neu not  overexpressed.  Ki67 89%  RADIOLOGY RESULTS: See E-Chart or I-Site for most recent results.  Images and reports are reviewed.  Mr Breast Bilateral W Wo Contrast  11/21/2013   CLINICAL DATA:  Patient underwent ultrasound-guided core needle biopsy of the dominant mass in the lower inner quadrant of the left breast with results indicating invasive ductal carcinoma, on 11/13/2013; satellite mass identified with second biopsy marker clip located chest lateral/anterior/cranial to the dominant mass by 1.9 cm was also biopsies with results indicating fibrocystic change  EXAM: BILATERAL BREAST MRI WITH AND WITHOUT CONTRAST  TECHNIQUE: Multiplanar, multisequence MR images of both breasts were obtained prior to and following the intravenous administration of 20m of MultiHance.  THREE-DIMENSIONAL MR IMAGE RENDERING ON INDEPENDENT WORKSTATION:  Three-dimensional MR images were rendered by post-processing of the original MR data on an independent workstation. The three-dimensional MR images were interpreted, and findings are reported in the following complete MRI report for this study. Three dimensional images were evaluated at the independent DynaCad workstation  COMPARISON:  Previous exams  FINDINGS: Breast composition: c:  Heterogeneous fibroglandular tissue  Background parenchymal enhancement: Moderate  Right breast: No mass or abnormal enhancement.  Left breast: In the 8 o'clock position of the left breast anteriorly, there is an enhancing mass. It is round with irregular borders and it measures 21 x 24 x 26 mm. There is a biopsy marker clip within the mass. 1.9 cm lateral/anterior/ cranial to the mass is a seconds  biopsy marker clip. This clip is surrounded by minimal normal parenchymal enhancement and minimal rim enhancement consistent with post biopsy change. There is no mass appreciated in this area. Ultrasound also demonstrated evidence of possible satellite masses immediately inferior to the dominant mass, but none are seen by MRI.  Lymph nodes: Normal bilateral axillary lymph nodes  Ancillary findings:  None.  IMPRESSION: Known invasive carcinoma 8 o'clock position left breast. A potential satellite mass was also biopsied as described above, with no evidence of mass or suspicious enhancement corresponding to the second biopsy site.  RECOMMENDATION: Treatment planning  BI-RADS CATEGORY  6: Known biopsy-proven malignancy.   Electronically Signed   By: RSkipper ClicheM.D.   On: 11/21/2013 14:02   Mm Digital Diagnostic Bilat  11/06/2013   CLINICAL DATA:  63year old patient with palpable mass in the lower inner quadrant of the left breast.  EXAM: DIGITAL DIAGNOSTIC  BILATERAL MAMMOGRAM WITH CAD  ULTRASOUND LEFT BREAST  COMPARISON:  With priors  ACR Breast Density Category c: The breast tissue is heterogeneously dense, which may obscure small masses.  FINDINGS: Focal spot compression view of the region of palpable concern in the lower inner quadrant of the left breast demonstrates an approximately 2.4 cm mass with lobulated margins. The remainder of the left breast appears stable. No distortion or suspicious microcalcification is identified on the left.  No mass, distortion, or suspicious microcalcification is identified in the right breast is does malignancy.  Mammographic images were processed with CAD.  On physical exam, there is a firm fixed mass in the left breast at approximately 8:30 position 5 cm from the nipple.  Ultrasound is performed, showing an irregular hypoechoic solid mass with prominent internal vascularity 8:30 position 5 cm from the nipple that measures 2.1 x 1.7 x 1.8 cm. Approximately 1.7 cm medial to  the medial margin of this dominant mass is an oval circumscribed hypoechoic mass that measures 0.7 x 0.5 x 0.7 cm. Just slightly inferior to the palpable mass are at least 2  hypoechoic nodules, the largest measuring 8 mm and the smaller measuring 4 mm. These are suspicious for satellite tumor nodules.  Ultrasound of the left axilla demonstrates a normal axillary lymph node with prominent fatty hilum. No suspicious lymph nodes are detected.  IMPRESSION: Findings highly suspicious for invasive mammary carcinoma of the left breast at the site of the palpable lump. There are 3 solid nodules in the region of the palpable lump, for which satellite tumor nodules cannot be excluded.  No evidence of malignancy in the right breast.  RECOMMENDATION: Ultrasound-guided biopsy of the dominant palpable mass, and of the nodule most distal (1.7 cm medial to) the dominant mass. Ultrasound-guided biopsies have been scheduled for 11/13/2013 at 8:30 a.m.  I have discussed the findings and recommendations with the patient. Results were also provided in writing at the conclusion of the visit. If applicable, a reminder letter will be sent to the patient regarding the next appointment.  BI-RADS CATEGORY  5: Highly suggestive of malignancy - appropriate action should be taken.   Electronically Signed   By: Curlene Dolphin M.D.   On: 11/06/2013 13:16   Mm Digital Diagnostic Unilat L  11/13/2013   CLINICAL DATA:  Left breast masses  EXAM: POST-BIOPSY CLIP PLACEMENT LEFT DIAGNOSTIC MAMMOGRAM  COMPARISON:  Previous exams.  FINDINGS: Films are performed following ultrasound guided biopsy of left breast masses. Images show a ribbon type clip in association with a dominant, palpable mass in the lower inner quadrant of the right breast, anteriorly an appropriate position (site 1 of 2). Images show a wing type clip in association with a satellite mass in the lower inner quadrant, medial to the dominant mass (site 2 of 2). Clip is also in appropriate  position.  IMPRESSION: Adequate clip placement follow ultrasound-guided left breast biopsies.  Final Assessment: Post Procedure Mammograms for Marker Placement   Electronically Signed   By: Duke Salvia M.D.   On: 11/13/2013 10:41   US Breast Ltd Uni Left Inc Axilla  11/06/2013   CLINICAL DATA:  63 year old patient with palpable mass in the lower inner quadrant of the left breast.  EXAM: DIGITAL DIAGNOSTIC  BILATERAL MAMMOGRAM WITH CAD  ULTRASOUND LEFT BREAST  COMPARISON:  With priors  ACR Breast Density Category c: The breast tissue is heterogeneously dense, which may obscure small masses.  FINDINGS: Focal spot compression view of the region of palpable concern in the lower inner quadrant of the left breast demonstrates an approximately 2.4 cm mass with lobulated margins. The remainder of the left breast appears stable. No distortion or suspicious microcalcification is identified on the left.  No mass, distortion, or suspicious microcalcification is identified in the right breast is does malignancy.  Mammographic images were processed with CAD.  On physical exam, there is a firm fixed mass in the left breast at approximately 8:30 position 5 cm from the nipple.  Ultrasound is performed, showing an irregular hypoechoic solid mass with prominent internal vascularity 8:30 position 5 cm from the nipple that measures 2.1 x 1.7 x 1.8 cm. Approximately 1.7 cm medial to the medial margin of this dominant mass is an oval circumscribed hypoechoic mass that measures 0.7 x 0.5 x 0.7 cm. Just slightly inferior to the palpable mass are at least 2 hypoechoic nodules, the largest measuring 8 mm and the smaller measuring 4 mm. These are suspicious for satellite tumor nodules.  Ultrasound of the left axilla demonstrates a normal axillary lymph node with prominent fatty hilum. No suspicious lymph nodes are detected.  IMPRESSION: Findings highly suspicious for invasive mammary carcinoma of the left breast at the site of the  palpable lump. There are 3 solid nodules in the region of the palpable lump, for which satellite tumor nodules cannot be excluded.  No evidence of malignancy in the right breast.  RECOMMENDATION: Ultrasound-guided biopsy of the dominant palpable mass, and of the nodule most distal (1.7 cm medial to) the dominant mass. Ultrasound-guided biopsies have been scheduled for 11/13/2013 at 8:30 a.m.  I have discussed the findings and recommendations with the patient. Results were also provided in writing at the conclusion of the visit. If applicable, a reminder letter will be sent to the patient regarding the next appointment.  BI-RADS CATEGORY  5: Highly suggestive of malignancy - appropriate action should be taken.   Electronically Signed   By: Curlene Dolphin M.D.   On: 11/06/2013 13:16   [place] Lt Breast Bx W Loc Dev 1st Lesion Img Bx Spec US Guide  11/14/2013   ADDENDUM REPORT: 11/14/2013 15:07  ADDENDUM: The final pathological diagnosis for the dominant, palpable mass is invasive ductal carcinoma. This is concordant with the imaging findings. The final pathological diagnosis for the smaller, satellite lesion is benign breast tissue with fibrocystic change. This may be concordant with the imaging findings if the hypoechoic mass actually represented a mildly complex cyst. Otherwise, this is not concordant with the imaging findings. This area, and the two small nearby areas seen at ultrasound, can be further evaluated at staging MRI.  The final pathological diagnosis was discussed with the patient by telephone on 11/14/2013. Her questions were answered. She reported no pain, bruising or palpable hematoma at the biopsy sites.  The patient requested an appointment with Dr. Barry Dienes. She was given an appointment with Dr. Barry Dienes at 11:30 a.m. on 11/25/2013. The patient's schedule did not allow a first available breast MRI on 11/19/2013. Therefore, the patient will contact [place] at [facility] directly to  find a time for the MRI that works with her schedule.   Electronically Signed   By: Enrique Sack M.D.   On: 11/14/2013 15:07   11/14/2013   CLINICAL DATA:  Palpable left breast mass. A satellite lesion noted on ultrasound.  EXAM: ULTRASOUND GUIDED LEFT BREAST CORE NEEDLE BIOPSIES  COMPARISON:  Previous exams.  FINDINGS: I met with the patient and we discussed the procedure of ultrasound-guided biopsy, including benefits and alternatives. We discussed the high likelihood of a successful procedure. We discussed the risks of the procedure, including infection, bleeding, tissue injury, clip migration, and inadequate sampling. Informed written consent was given. The usual time-out protocol was performed immediately prior to the procedure.  Using sterile technique and 2% Lidocaine as local anesthetic, under direct ultrasound visualization, a 14 gauge spring-loaded device was used to perform biopsy of the dominant, palpable mass using an inferior approach (SITE 1 of 2). A ribbon type tissue marker clip was deployed into the biopsy cavity.  Using sterile technique and 2% Lidocaine as local anesthetic, under direct ultrasound visualization, a 14 gauge spring-loaded device was used to perform biopsy of satellite lesion using an inferior approach (SITE 2 of 2). A wing type tissue marker clip was deployed into the biopsy cavity.  Follow up 2 view mammogram was performed and dictated separately.  IMPRESSION: Ultrasound guided biopsy of left breast masses. No apparent complications.  Electronically Signed: By: Duke Salvia M.D. On: 11/13/2013 10:43   [place] Lt Breast Bx W Loc Dev Ea Add Lesion Img  Bx Spec US Guide  11/14/2013   ADDENDUM REPORT: 11/14/2013 15:07  ADDENDUM: The final pathological diagnosis for the dominant, palpable mass is invasive ductal carcinoma. This is concordant with the imaging findings. The final pathological diagnosis for the smaller, satellite lesion is benign breast tissue with fibrocystic change.  This may be concordant with the imaging findings if the hypoechoic mass actually represented a mildly complex cyst. Otherwise, this is not concordant with the imaging findings. This area, and the two small nearby areas seen at ultrasound, can be further evaluated at staging MRI.  The final pathological diagnosis was discussed with the patient by telephone on 11/14/2013. Her questions were answered. She reported no pain, bruising or palpable hematoma at the biopsy sites.  The patient requested an appointment with Dr. Barry Dienes. She was given an appointment with Dr. Barry Dienes at 11:30 a.m. on 11/25/2013. The patient's schedule did not allow a first available breast MRI on 11/19/2013. Therefore, the patient will contact [place] at [facility] directly to find a time for the MRI that works with her schedule.   Electronically Signed   By: Enrique Sack M.D.   On: 11/14/2013 15:07   11/14/2013   CLINICAL DATA:  Palpable left breast mass. A satellite lesion noted on ultrasound.  EXAM: ULTRASOUND GUIDED LEFT BREAST CORE NEEDLE BIOPSIES  COMPARISON:  Previous exams.  FINDINGS: I met with the patient and we discussed the procedure of ultrasound-guided biopsy, including benefits and alternatives. We discussed the high likelihood of a successful procedure. We discussed the risks of the procedure, including infection, bleeding, tissue injury, clip migration, and inadequate sampling. Informed written consent was given. The usual time-out protocol was performed immediately prior to the procedure.  Using sterile technique and 2% Lidocaine as local anesthetic, under direct ultrasound visualization, a 14 gauge spring-loaded device was used to perform biopsy of the dominant, palpable mass using an inferior approach (SITE 1 of 2). A ribbon type tissue marker clip was deployed into the biopsy cavity.  Using sterile technique and 2% Lidocaine as local anesthetic, under direct ultrasound visualization, a 14 gauge  spring-loaded device was used to perform biopsy of satellite lesion using an inferior approach (SITE 2 of 2). A wing type tissue marker clip was deployed into the biopsy cavity.  Follow up 2 view mammogram was performed and dictated separately.  IMPRESSION: Ultrasound guided biopsy of left breast masses. No apparent complications.  Electronically Signed: By: Duke Salvia M.D. On: 11/13/2013 10:43      ASSESSMENT AND PLAN: Cancer of lower-inner quadrant of left female breast Pt has a 2.6 cm lesion in the left breast.  This appears to be a cT2N0 tumor.    She desires to have surgery up front and not have neoadjuvant chemotherapy.    She has adequate size breasts for this.  The second lesion that was biopsied was benign.   The surgical procedure was described to the patient.  I discussed the incision type and location and whether we would need radiology involved on the day of surgery with a wire marker and/or sentinel node.      The risks and benefits of the procedure were described to the patient and he/she wishes to proceed.    We discussed the risks bleeding, infection, damage to other structures, need for further procedures/surgeries.  We discussed the risk of seroma.  The patient was advised if the area in the breast in cancer, we may need to go back to surgery for additional  tissue to obtain negative margins or for a lymph node biopsy. The patient was advised that these are the most common complications, but that others can occur as well.  She was advised against taking aspirin or other anti-inflammatory agents/blood thinners the week before surgery.    She would like to have this done soon.    I will make referrals for rad onc, med onc, and genetics.     Milus Height MD Surgical Oncology, [facility]      Visit Diagnoses: 1. Breast cancer, left   2. Breast cancer, left breast     Primary Care Physician: Marton Redwood,  MD

## 2013-12-06 ENCOUNTER — Encounter (HOSPITAL_COMMUNITY): Payer: Self-pay | Admitting: General Surgery

## 2013-12-06 ENCOUNTER — Telehealth (INDEPENDENT_AMBULATORY_CARE_PROVIDER_SITE_OTHER): Payer: Self-pay | Admitting: General Surgery

## 2013-12-06 NOTE — Telephone Encounter (Signed)
Husband called for clarification:  Discharge instructions stated wife could RTW in one week, but FMLA papers have her out of work until December 30, 2013.  That is when her work is expecting her to back.  Stated they should plan of June 1, not the one week.  He understands.  Reminded of post op appt and they will call for any questions.

## 2013-12-09 ENCOUNTER — Ambulatory Visit: Payer: BC Managed Care – PPO | Admitting: Neurology

## 2013-12-10 ENCOUNTER — Other Ambulatory Visit (INDEPENDENT_AMBULATORY_CARE_PROVIDER_SITE_OTHER): Payer: Self-pay

## 2013-12-10 DIAGNOSIS — C50919 Malignant neoplasm of unspecified site of unspecified female breast: Secondary | ICD-10-CM

## 2013-12-10 MED ORDER — UNABLE TO FIND
1.0000 | Freq: Once | Status: DC
Start: 2013-12-10 — End: 2013-12-17

## 2013-12-12 ENCOUNTER — Telehealth (INDEPENDENT_AMBULATORY_CARE_PROVIDER_SITE_OTHER): Payer: Self-pay | Admitting: General Surgery

## 2013-12-12 ENCOUNTER — Other Ambulatory Visit (INDEPENDENT_AMBULATORY_CARE_PROVIDER_SITE_OTHER): Payer: Self-pay | Admitting: General Surgery

## 2013-12-12 NOTE — Telephone Encounter (Signed)
Discussed negative nodes, but positive margin.   Will need to go back for reexcision.

## 2013-12-17 ENCOUNTER — Other Ambulatory Visit (INDEPENDENT_AMBULATORY_CARE_PROVIDER_SITE_OTHER): Payer: Self-pay | Admitting: General Surgery

## 2013-12-17 ENCOUNTER — Ambulatory Visit (INDEPENDENT_AMBULATORY_CARE_PROVIDER_SITE_OTHER): Payer: BC Managed Care – PPO | Admitting: General Surgery

## 2013-12-17 ENCOUNTER — Encounter (INDEPENDENT_AMBULATORY_CARE_PROVIDER_SITE_OTHER): Payer: Self-pay

## 2013-12-17 ENCOUNTER — Other Ambulatory Visit (INDEPENDENT_AMBULATORY_CARE_PROVIDER_SITE_OTHER): Payer: Self-pay

## 2013-12-17 ENCOUNTER — Encounter (INDEPENDENT_AMBULATORY_CARE_PROVIDER_SITE_OTHER): Payer: Self-pay | Admitting: General Surgery

## 2013-12-17 ENCOUNTER — Telehealth: Payer: Self-pay | Admitting: *Deleted

## 2013-12-17 VITALS — BP 120/80 | HR 80 | Resp 16 | Ht 63.5 in | Wt 200.6 lb

## 2013-12-17 DIAGNOSIS — C50919 Malignant neoplasm of unspecified site of unspecified female breast: Secondary | ICD-10-CM

## 2013-12-17 DIAGNOSIS — C50912 Malignant neoplasm of unspecified site of left female breast: Secondary | ICD-10-CM

## 2013-12-17 DIAGNOSIS — C50319 Malignant neoplasm of lower-inner quadrant of unspecified female breast: Secondary | ICD-10-CM

## 2013-12-17 DIAGNOSIS — C50312 Malignant neoplasm of lower-inner quadrant of left female breast: Secondary | ICD-10-CM

## 2013-12-17 MED ORDER — UNABLE TO FIND: Status: DC

## 2013-12-17 NOTE — Assessment & Plan Note (Addendum)
Has reexcision planned for June 3.    Reviewed surgery.    She needs to see oncology.  She is T2N0.  They may want oncotype.  She will likely need antiestrogen therapy.     I will see her back around 2 weeks post op.

## 2013-12-17 NOTE — Telephone Encounter (Signed)
Error

## 2013-12-17 NOTE — Progress Notes (Signed)
HISTORY: Pt is s/p lumpectomy, SLN bx.  One of her margins is positive.  She is doing well overall.  She is a bit teary.  She is no longer taking pain medication.  She went to the beach.      EXAM: General:  Alert and oriented.   Incision:  Healing well.  Small hematoma.     PATHOLOGY: Diagnosis 1. Breast, lumpectomy, Left - INVASIVE DUCTAL CARCINOMA, SEE COMMENT. - INVASIVE TUMOR IS LESS THAN 0.1 MM FROM NEAREST MARGIN (INFERIOR). - NEGATIVE FOR LYMPH VASCULAR INVASION - DUCTAL CARCINOMA IN SITU. - IN SITU CARCINOMA IS FOCALLY PRESENT AT MEDIAL MARGIN. - PREVIOUS BIOPSY SITE. - SEE TUMOR SYNOPTIC TEMPLATE BELOW. 2. Breast, excision, Left axilla new inferior margin, short stitch is new inferior - BENIGN BREAST TISSUE, SEE COMMENT. - NEGATIVE FOR ATYPIA OR MALIGNANCY. - SURGICAL MARGIN, NEGATIVE FOR ATYPIA OR MALIGNANCY. 3. Lymph node, sentinel, biopsy, Left axillary # 1 - ONE LYMPH NODE, NEGATIVE FOR TUMOR (0/1). 4. Lymph node, sentinel, biopsy, Left axillary #2 - ONE LYMPH NODE, NEGATIVE FOR TUMOR (0/1). 5. Lymph node, biopsy, Left axillary - ONE LYMPH NODE, NEGATIVE FOR TUMOR (0/1).   ASSESSMENT AND PLAN:   Cancer of lower-inner quadrant of left female breast Has reexcision planned for June 3.    Reviewed surgery.          Milus Height, MD Surgical Oncology, [facility]  Marton Redwood, MD Marton Redwood, MD

## 2013-12-17 NOTE — Patient Instructions (Signed)
Surgery in 2 weeks.    See you then.    Follow up 2 weeks after June 3.

## 2013-12-18 ENCOUNTER — Telehealth: Payer: Self-pay | Admitting: *Deleted

## 2013-12-18 NOTE — Telephone Encounter (Signed)
Received referral from Dr. Marlowe Aschoff office for Medical Oncology.  Understood that pt was to have an Oncotype Dx test completed.  Went to [employer] to verify and she was going to get with Dr. Barry Dienes to see if she ordered it.  She will f/u with me so I will know on when to plan on the results to be back to schedule her with Dr. Jana Hakim.  Emailed Bernie at [employer] to make her aware.

## 2013-12-19 ENCOUNTER — Encounter: Payer: Self-pay | Admitting: *Deleted

## 2013-12-19 ENCOUNTER — Telehealth: Payer: Self-pay | Admitting: *Deleted

## 2013-12-19 NOTE — Telephone Encounter (Signed)
Spoke with Dr. Jana Hakim and obtained direction on how to schedule the pt.  Called & confirmed 02/18/14 appt w/ pt.  Unable to schedule the labs - gave to [facility] to enter.  Mailed before appt letter, welcome packet & intake form to pt.  Emailed Bernie at [employer] to make her aware.

## 2013-12-19 NOTE — Progress Notes (Signed)
Received oncotype order from Dr. Jana Hakim.  Requisition sent to pathology.  Received by Tammy.  PAC sent to [facility].

## 2013-12-26 ENCOUNTER — Encounter (HOSPITAL_BASED_OUTPATIENT_CLINIC_OR_DEPARTMENT_OTHER): Payer: Self-pay | Admitting: *Deleted

## 2013-12-26 ENCOUNTER — Telehealth (INDEPENDENT_AMBULATORY_CARE_PROVIDER_SITE_OTHER): Payer: Self-pay

## 2013-12-26 NOTE — Telephone Encounter (Signed)
Pt is scheduled for a reexcision of the left breast. Pt states that she has stopped taking her hormone medication. Pt states that she has started taking Estrogen that was OTC, due to her hot flashes. Pt went for her pre-op appt and they advised her to call Dr Barry Dienes to see if she would need to stop the Estrogen OTC before her surgery. Informed pt that Dr Barry Dienes is out of the office until next week, however we would speak with Dr Zella Richer for his recommendation and we would get back in touch with her. Pt verbalized understanding and agrees with POC.

## 2013-12-26 NOTE — Telephone Encounter (Signed)
LMOM to give [place] a call back, Dr Zella Richer suggested that if she was able to stop taking the Estrogen  OTC that he would recommend her to stop this and then resume taking post surgery.

## 2013-12-26 NOTE — Progress Notes (Signed)
No new labs needed-for re-exc Bring cpap and meds

## 2013-12-27 ENCOUNTER — Encounter: Payer: Self-pay | Admitting: *Deleted

## 2013-12-27 NOTE — Telephone Encounter (Signed)
Informed pt that Dr Zella Richer recommended that she stopp taking the Estrogen OTC. Pt states that she can tolerate coming off of this until after surgery. Pt verbalized understanding and agrees with POC.

## 2013-12-27 NOTE — Progress Notes (Signed)
Received oncotype of 15.  Copy to Dr. Jana Hakim.  Copy to medical records to scan.  Also emailed Dr. Jana Hakim with results.

## 2013-12-30 ENCOUNTER — Encounter (HOSPITAL_COMMUNITY): Payer: Self-pay

## 2014-01-01 ENCOUNTER — Encounter (HOSPITAL_BASED_OUTPATIENT_CLINIC_OR_DEPARTMENT_OTHER): Payer: Self-pay | Admitting: Anesthesiology

## 2014-01-01 ENCOUNTER — Encounter (HOSPITAL_BASED_OUTPATIENT_CLINIC_OR_DEPARTMENT_OTHER)
Admission: RE | Disposition: A | Discharge status: Home / Self Care | Payer: Self-pay | Source: Ambulatory Visit | Attending: General Surgery

## 2014-01-01 ENCOUNTER — Ambulatory Visit (HOSPITAL_BASED_OUTPATIENT_CLINIC_OR_DEPARTMENT_OTHER)
Admission: RE | Admit: 2014-01-01 | Discharge: 2014-01-01 | Disposition: A | Discharge status: Home / Self Care | Payer: BC Managed Care – PPO | Source: Ambulatory Visit | Attending: General Surgery | Admitting: General Surgery

## 2014-01-01 ENCOUNTER — Ambulatory Visit (HOSPITAL_BASED_OUTPATIENT_CLINIC_OR_DEPARTMENT_OTHER): Payer: BC Managed Care – PPO | Admitting: Anesthesiology

## 2014-01-01 ENCOUNTER — Encounter (HOSPITAL_BASED_OUTPATIENT_CLINIC_OR_DEPARTMENT_OTHER): Payer: BC Managed Care – PPO | Admitting: Anesthesiology

## 2014-01-01 DIAGNOSIS — Z79899 Other long term (current) drug therapy: Secondary | ICD-10-CM | POA: Insufficient documentation

## 2014-01-01 DIAGNOSIS — G4730 Sleep apnea, unspecified: Secondary | ICD-10-CM | POA: Insufficient documentation

## 2014-01-01 DIAGNOSIS — D4860 Neoplasm of uncertain behavior of unspecified breast: Secondary | ICD-10-CM

## 2014-01-01 DIAGNOSIS — C50319 Malignant neoplasm of lower-inner quadrant of unspecified female breast: Secondary | ICD-10-CM | POA: Insufficient documentation

## 2014-01-01 DIAGNOSIS — Z8701 Personal history of pneumonia (recurrent): Secondary | ICD-10-CM | POA: Insufficient documentation

## 2014-01-01 DIAGNOSIS — N6019 Diffuse cystic mastopathy of unspecified breast: Secondary | ICD-10-CM

## 2014-01-01 DIAGNOSIS — I10 Essential (primary) hypertension: Secondary | ICD-10-CM | POA: Insufficient documentation

## 2014-01-01 DIAGNOSIS — K219 Gastro-esophageal reflux disease without esophagitis: Secondary | ICD-10-CM | POA: Insufficient documentation

## 2014-01-01 DIAGNOSIS — D0590 Unspecified type of carcinoma in situ of unspecified breast: Secondary | ICD-10-CM

## 2014-01-01 HISTORY — PX: RE-EXCISION OF BREAST LUMPECTOMY: SHX6048

## 2014-01-01 HISTORY — DX: Presence of spectacles and contact lenses: Z97.3

## 2014-01-01 SURGERY — EXCISION, LESION, BREAST
Anesthesia: General | Site: Breast | Laterality: Left

## 2014-01-01 MED ORDER — FENTANYL CITRATE 0.05 MG/ML IJ SOLN
INTRAMUSCULAR | Status: DC | PRN
  Administered 2014-01-01 (×2): 50 ug via INTRAVENOUS

## 2014-01-01 MED ORDER — ACETAMINOPHEN 650 MG RE SUPP: 650.0000 mg | RECTAL | Status: DC | PRN

## 2014-01-01 MED ORDER — MIDAZOLAM HCL 2 MG/2ML IJ SOLN
INTRAMUSCULAR | Status: AC
  Filled 2014-01-01: qty 2

## 2014-01-01 MED ORDER — LIDOCAINE HCL (CARDIAC) 20 MG/ML IV SOLN
INTRAVENOUS | Status: DC | PRN
Start: 2014-01-01 — End: 2014-01-01
  Administered 2014-01-01: 40 mg via INTRAVENOUS

## 2014-01-01 MED ORDER — SODIUM CHLORIDE 0.9 % IV SOLN: 250.0000 mL | INTRAVENOUS | Status: DC | PRN

## 2014-01-01 MED ORDER — SODIUM CHLORIDE 0.9 % IJ SOLN: 3.0000 mL | INTRAMUSCULAR | Status: DC | PRN

## 2014-01-01 MED ORDER — OXYCODONE HCL 5 MG PO TABS
5.0000 mg | ORAL_TABLET | Freq: Once | ORAL | Status: AC | PRN
  Administered 2014-01-01: 5 mg via ORAL

## 2014-01-01 MED ORDER — FENTANYL CITRATE 0.05 MG/ML IJ SOLN
INTRAMUSCULAR | Status: AC
  Filled 2014-01-01: qty 6

## 2014-01-01 MED ORDER — SODIUM CHLORIDE 0.9 % IJ SOLN: 3.0000 mL | Freq: Two times a day (BID) | INTRAMUSCULAR | Status: DC

## 2014-01-01 MED ORDER — CEFAZOLIN SODIUM-DEXTROSE 2-3 GM-% IV SOLR
2.0000 g | INTRAVENOUS | Status: AC
  Administered 2014-01-01: 2 g via INTRAVENOUS

## 2014-01-01 MED ORDER — MIDAZOLAM HCL 2 MG/2ML IJ SOLN: 1.0000 mg | INTRAMUSCULAR | Status: DC | PRN

## 2014-01-01 MED ORDER — PROPOFOL 10 MG/ML IV BOLUS
INTRAVENOUS | Status: AC
  Filled 2014-01-01: qty 20

## 2014-01-01 MED ORDER — ACETAMINOPHEN 325 MG PO TABS: 650.0000 mg | ORAL_TABLET | ORAL | Status: DC | PRN

## 2014-01-01 MED ORDER — BUPIVACAINE HCL (PF) 0.25 % IJ SOLN
INTRAMUSCULAR | Status: AC
  Filled 2014-01-01: qty 30

## 2014-01-01 MED ORDER — HYDROCODONE-ACETAMINOPHEN 5-325 MG PO TABS: 1.0000 | ORAL_TABLET | ORAL | Status: DC | PRN

## 2014-01-01 MED ORDER — DEXAMETHASONE SODIUM PHOSPHATE 4 MG/ML IJ SOLN
INTRAMUSCULAR | Status: DC | PRN
  Administered 2014-01-01: 10 mg via INTRAVENOUS

## 2014-01-01 MED ORDER — FENTANYL CITRATE 0.05 MG/ML IJ SOLN: 50.0000 ug | INTRAMUSCULAR | Status: DC | PRN

## 2014-01-01 MED ORDER — MIDAZOLAM HCL 5 MG/5ML IJ SOLN
INTRAMUSCULAR | Status: DC | PRN
  Administered 2014-01-01: 2 mg via INTRAVENOUS

## 2014-01-01 MED ORDER — LACTATED RINGERS IV SOLN
INTRAVENOUS | Status: DC
  Administered 2014-01-01: 14:00:00 via INTRAVENOUS

## 2014-01-01 MED ORDER — OXYCODONE HCL 5 MG PO TABS: 5.0000 mg | ORAL_TABLET | ORAL | Status: DC | PRN

## 2014-01-01 MED ORDER — METOCLOPRAMIDE HCL 5 MG/ML IJ SOLN
INTRAMUSCULAR | Status: DC | PRN
  Administered 2014-01-01 (×2): 5 mg via INTRAVENOUS

## 2014-01-01 MED ORDER — HYDROMORPHONE HCL PF 1 MG/ML IJ SOLN: 0.2500 mg | INTRAMUSCULAR | Status: DC | PRN

## 2014-01-01 MED ORDER — OXYCODONE HCL 5 MG/5ML PO SOLN: 5.0000 mg | Freq: Once | ORAL | Status: AC | PRN

## 2014-01-01 MED ORDER — METOCLOPRAMIDE HCL 5 MG/ML IJ SOLN: 10.0000 mg | Freq: Once | INTRAMUSCULAR | Status: DC | PRN

## 2014-01-01 MED ORDER — PROPOFOL 10 MG/ML IV BOLUS
INTRAVENOUS | Status: DC | PRN
  Administered 2014-01-01: 200 mg via INTRAVENOUS

## 2014-01-01 MED ORDER — CEFAZOLIN SODIUM-DEXTROSE 2-3 GM-% IV SOLR
INTRAVENOUS | Status: AC
  Filled 2014-01-01: qty 50

## 2014-01-01 MED ORDER — OXYCODONE HCL 5 MG PO TABS
ORAL_TABLET | ORAL | Status: AC
  Filled 2014-01-01: qty 1

## 2014-01-01 MED ORDER — OXYCODONE-ACETAMINOPHEN 5-325 MG PO TABS: 1.0000 | ORAL_TABLET | ORAL | Status: DC | PRN

## 2014-01-01 SURGICAL SUPPLY — 52 items
BENZOIN TINCTURE PRP APPL 2/3 (GAUZE/BANDAGES/DRESSINGS) IMPLANT
BINDER BREAST XXLRG (GAUZE/BANDAGES/DRESSINGS) ×3 IMPLANT
BLADE 15 SAFETY STRL DISP (BLADE) IMPLANT
BLADE HEX COATED 2.75 (ELECTRODE) ×3 IMPLANT
BLADE SURG 15 STRL LF DISP TIS (BLADE) ×1 IMPLANT
BLADE SURG 15 STRL SS (BLADE) ×2
CANISTER SUCT 1200ML W/VALVE (MISCELLANEOUS) ×3 IMPLANT
CHLORAPREP W/TINT 26ML (MISCELLANEOUS) ×3 IMPLANT
CLIP TI LARGE 6 (CLIP) IMPLANT
CLOSURE WOUND 1/2 X4 (GAUZE/BANDAGES/DRESSINGS) ×1
COVER MAYO STAND STRL (DRAPES) ×3 IMPLANT
COVER TABLE BACK 60X90 (DRAPES) ×3 IMPLANT
DECANTER SPIKE VIAL GLASS SM (MISCELLANEOUS) IMPLANT
DRAPE PED LAPAROTOMY (DRAPES) ×3 IMPLANT
DRAPE UTILITY XL STRL (DRAPES) ×3 IMPLANT
DRSG PAD ABDOMINAL 8X10 ST (GAUZE/BANDAGES/DRESSINGS) ×3 IMPLANT
ELECT REM PT RETURN 9FT ADLT (ELECTROSURGICAL) ×3
ELECTRODE REM PT RTRN 9FT ADLT (ELECTROSURGICAL) ×1 IMPLANT
GLOVE BIO SURGEON STRL SZ 6 (GLOVE) ×3 IMPLANT
GLOVE BIOGEL M STRL SZ7.5 (GLOVE) ×3 IMPLANT
GLOVE BIOGEL PI IND STRL 6.5 (GLOVE) ×1 IMPLANT
GLOVE BIOGEL PI IND STRL 8 (GLOVE) ×1 IMPLANT
GLOVE BIOGEL PI INDICATOR 6.5 (GLOVE) ×2
GLOVE BIOGEL PI INDICATOR 8 (GLOVE) ×2
GOWN STRL REUS W/ TWL LRG LVL3 (GOWN DISPOSABLE) IMPLANT
GOWN STRL REUS W/ TWL XL LVL3 (GOWN DISPOSABLE) ×1 IMPLANT
GOWN STRL REUS W/TWL 2XL LVL3 (GOWN DISPOSABLE) ×3 IMPLANT
GOWN STRL REUS W/TWL LRG LVL3 (GOWN DISPOSABLE)
GOWN STRL REUS W/TWL XL LVL3 (GOWN DISPOSABLE) ×2
NEEDLE HYPO 25X1 1.5 SAFETY (NEEDLE) ×3 IMPLANT
NS IRRIG 1000ML POUR BTL (IV SOLUTION) ×3 IMPLANT
PACK BASIN DAY SURGERY FS (CUSTOM PROCEDURE TRAY) ×3 IMPLANT
PENCIL BUTTON HOLSTER BLD 10FT (ELECTRODE) ×3 IMPLANT
SLEEVE SCD COMPRESS KNEE MED (MISCELLANEOUS) ×3 IMPLANT
SPONGE GAUZE 4X4 12PLY STER LF (GAUZE/BANDAGES/DRESSINGS) ×3 IMPLANT
SPONGE GAUZE 4X4 16PLY UNSTER (WOUND CARE) ×6 IMPLANT
SPONGE LAP 18X18 X RAY DECT (DISPOSABLE) IMPLANT
STAPLER VISISTAT 35W (STAPLE) IMPLANT
STRIP CLOSURE SKIN 1/2X4 (GAUZE/BANDAGES/DRESSINGS) ×2 IMPLANT
SUT MON AB 4-0 PC3 18 (SUTURE) ×3 IMPLANT
SUT SILK 2 0 SH (SUTURE) IMPLANT
SUT VIC AB 3-0 54X BRD REEL (SUTURE) IMPLANT
SUT VIC AB 3-0 BRD 54 (SUTURE)
SUT VIC AB 3-0 SH 27 (SUTURE) ×2
SUT VIC AB 3-0 SH 27X BRD (SUTURE) ×1 IMPLANT
SYR BULB 3OZ (MISCELLANEOUS) ×3 IMPLANT
SYR CONTROL 10ML LL (SYRINGE) ×3 IMPLANT
TOWEL OR 17X24 6PK STRL BLUE (TOWEL DISPOSABLE) ×3 IMPLANT
TOWEL OR NON WOVEN STRL DISP B (DISPOSABLE) IMPLANT
TUBE CONNECTING 20'X1/4 (TUBING) ×1
TUBE CONNECTING 20X1/4 (TUBING) ×2 IMPLANT
YANKAUER SUCT BULB TIP NO VENT (SUCTIONS) ×3 IMPLANT

## 2014-01-01 NOTE — Anesthesia Postprocedure Evaluation (Signed)
Anesthesia Post Note  Patient: Connie [name]  Procedure(s) Performed: Procedure(s) (LRB): RE-EXCISION OF left BREAST cancer  (Left)  Anesthesia type: General  Patient location: PACU  Post pain: Pain level controlled  Post assessment: Patient's Cardiovascular Status Stable  Last Vitals:  Filed Vitals:   01/01/14 1645  BP: 106/51  Pulse: 60  Temp:   Resp: 15    Post vital signs: Reviewed and stable  Level of consciousness: alert  Complications: No apparent anesthesia complications

## 2014-01-01 NOTE — Op Note (Signed)
Re-excisional left Breast Lumpectomy   Indications: This patient presents with history of positive margins after partial mastectomy for left breast cancer   Pre-operative Diagnosis: left breast cancer   Post-operative Diagnosis: left breast cancer   Surgeon: [name]   Assistants: n/a   Anesthesia: General anesthesia and Local anesthesia 0.25% bupivacaine, with epinephrine   ASA Class: 2   Procedure Details  The patient was seen in the Holding Room. The risks, benefits, complications, treatment options, and expected outcomes were discussed with the patient. The possibilities of reaction to medication, pulmonary aspiration, bleeding, infection, the need for additional procedures, failure to diagnose a condition, and creating a complication requiring transfusion or operation were discussed with the patient. The patient concurred with the proposed plan, giving informed consent. The site of surgery properly noted/marked. The patient was taken to Operating Room # 2, identified, and the procedure verified as re-excision of left breast cancer.  After induction of anesthesia, the left breast and chest were prepped and draped in standard fashion.  The lumpectomy was performed by reopening the prior incision. Seroma was aspirated. The mastopexy sutures were removed. Additional margins were taken at the medial border of the partial mastectomy cavity. Dissection was carried down to the pectoral fascia. Orientation sutures were placed in the specimens. Hemostasis was achieved with cautery. The wound was irrigated and closed with a 3-0 Vicryl deep dermal interrupted and a 4-0 Monocryl subcuticular closure in layers.  Sterile dressings were applied. At the end of the operation, all sponge, instrument, and needle counts were correct.   Findings:  grossly clear surgical margins  Estimated Blood Loss: Minimal   Drains: none   Specimens: additional medial margin  Complications: None; patient  tolerated the procedure well.   Disposition: PACU - hemodynamically stable.   Condition: stable

## 2014-01-01 NOTE — H&P (View-Only) (Signed)
HISTORY: Pt is s/p lumpectomy, SLN bx.  One of her margins is positive.  She is doing well overall.  She is a bit teary.  She is no longer taking pain medication.  She went to the beach.      EXAM: General:  Alert and oriented.   Incision:  Healing well.  Small hematoma.     PATHOLOGY: Diagnosis 1. Breast, lumpectomy, Left - INVASIVE DUCTAL CARCINOMA, SEE COMMENT. - INVASIVE TUMOR IS LESS THAN 0.1 MM FROM NEAREST MARGIN (INFERIOR). - NEGATIVE FOR LYMPH VASCULAR INVASION - DUCTAL CARCINOMA IN SITU. - IN SITU CARCINOMA IS FOCALLY PRESENT AT MEDIAL MARGIN. - PREVIOUS BIOPSY SITE. - SEE TUMOR SYNOPTIC TEMPLATE BELOW. 2. Breast, excision, Left axilla new inferior margin, short stitch is new inferior - BENIGN BREAST TISSUE, SEE COMMENT. - NEGATIVE FOR ATYPIA OR MALIGNANCY. - SURGICAL MARGIN, NEGATIVE FOR ATYPIA OR MALIGNANCY. 3. Lymph node, sentinel, biopsy, Left axillary # 1 - ONE LYMPH NODE, NEGATIVE FOR TUMOR (0/1). 4. Lymph node, sentinel, biopsy, Left axillary #2 - ONE LYMPH NODE, NEGATIVE FOR TUMOR (0/1). 5. Lymph node, biopsy, Left axillary - ONE LYMPH NODE, NEGATIVE FOR TUMOR (0/1).   ASSESSMENT AND PLAN:   Cancer of lower-inner quadrant of left female breast Has reexcision planned for June 3.    Reviewed surgery.          Raschelle Wisenbaker L Clinton Dragone, MD Surgical Oncology, General & Endocrine Surgery [facility]  Shaw, William, MD Shaw, William, MD    

## 2014-01-01 NOTE — Anesthesia Preprocedure Evaluation (Signed)
Anesthesia Evaluation  Patient identified by MRN, date of birth, ID band Patient awake    Reviewed: Allergy & Precautions, H&P , NPO status , Patient's Chart, lab work & pertinent test results, reviewed documented beta blocker date and time   Airway Mallampati: II TM Distance: >3 FB Neck ROM: full    Dental   Pulmonary sleep apnea , pneumonia -, resolved,  breath sounds clear to auscultation        Cardiovascular hypertension, On Medications and On Home Beta Blockers Rhythm:regular     Neuro/Psych  Headaches, PSYCHIATRIC DISORDERS    GI/Hepatic negative GI ROS, Neg liver ROS, GERD-  Medicated and Controlled,  Endo/Other  negative endocrine ROS  Renal/GU negative Renal ROS  negative genitourinary   Musculoskeletal   Abdominal   Peds  Hematology negative hematology ROS (+)   Anesthesia Other Findings See surgeon's H&P   Reproductive/Obstetrics negative OB ROS                           Anesthesia Physical Anesthesia Plan  ASA: II  Anesthesia Plan: General   Post-op Pain Management:    Induction: Intravenous  Airway Management Planned: LMA  Additional Equipment:   Intra-op Plan:   Post-operative Plan:   Informed Consent: I have reviewed the patients History and Physical, chart, labs and discussed the procedure including the risks, benefits and alternatives for the proposed anesthesia with the patient or authorized representative who has indicated his/her understanding and acceptance.   Dental Advisory Given  Plan Discussed with: CRNA and Surgeon  Anesthesia Plan Comments:         Anesthesia Quick Evaluation

## 2014-01-01 NOTE — Transfer of Care (Signed)
Immediate Anesthesia Transfer of Care Note  Patient: Connie [name]  Procedure(s) Performed: Procedure(s): RE-EXCISION OF left BREAST cancer  (Left)  Patient Location: PACU  Anesthesia Type:General  Level of Consciousness: awake, alert , oriented and patient cooperative  Airway & Oxygen Therapy: Patient Spontanous Breathing and Patient connected to face mask oxygen  Post-op Assessment: Report given to PACU RN and Post -op Vital signs reviewed and stable  Post vital signs: Reviewed and stable  Complications: No apparent anesthesia complications

## 2014-01-01 NOTE — Discharge Instructions (Addendum)
[facility] °Office Phone Number [phone] ° °BREAST BIOPSY/ PARTIAL MASTECTOMY: POST OP INSTRUCTIONS ° °Always review your discharge instruction sheet given to you by the facility where your surgery was performed. ° °IF YOU HAVE DISABILITY OR FAMILY LEAVE FORMS, YOU MUST BRING THEM TO THE OFFICE FOR PROCESSING.  DO NOT GIVE THEM TO YOUR DOCTOR. ° °1. A prescription for pain medication may be given to you upon discharge.  Take your pain medication as prescribed, if needed.  If narcotic pain medicine is not needed, then you may take acetaminophen (Tylenol) or ibuprofen (Advil) as needed. °2. Take your usually prescribed medications unless otherwise directed °3. If you need a refill on your pain medication, please contact your pharmacy.  They will contact our office to request authorization.  Prescriptions will not be filled after 5pm or on week-ends. °4. You should eat very light the first 24 hours after surgery, such as soup, crackers, pudding, etc.  Resume your normal diet the day after surgery. °5. Most patients will experience some swelling and bruising in the breast.  Ice packs and a good support bra will help.  Swelling and bruising can take several days to resolve.  °6. It is common to experience some constipation if taking pain medication after surgery.  Increasing fluid intake and taking a stool softener will usually help or prevent this problem from occurring.  A mild laxative (Milk of Magnesia or Miralax) should be taken according to package directions if there are no bowel movements after 48 hours. °7. Unless discharge instructions indicate otherwise, you may remove your bandages 48 hours after surgery, and you may shower at that time.  You may have steri-strips (small skin tapes) in place directly over the incision.  These strips should be left on the skin for 7-10 days.   Any sutures or staples will be removed at the office during your follow-up visit. °8. ACTIVITIES:  You may resume  regular daily activities (gradually increasing) beginning the next day.  Wearing a good support bra or sports bra (or the breast binder) minimizes pain and swelling.  You may have sexual intercourse when it is comfortable. °a. You may drive when you no longer are taking prescription pain medication, you can comfortably wear a seatbelt, and you can safely maneuver your car and apply brakes. °b. RETURN TO WORK:  __________1 week_______________ °9. You should see your doctor in the office for a follow-up appointment approximately two weeks after your surgery.  Your doctor’s nurse will typically make your follow-up appointment when she calls you with your pathology report.  Expect your pathology report 2-3 business days after your surgery.  You may call to check if you do not hear from us after three days. ° ° °WHEN TO CALL YOUR DOCTOR: °1. Fever over 101.0 °2. Nausea and/or vomiting. °3. Extreme swelling or bruising. °4. Continued bleeding from incision. °5. Increased pain, redness, or drainage from the incision. ° °The clinic staff is available to answer your questions during regular business hours.  Please don’t hesitate to call and ask to speak to one of the nurses for clinical concerns.  If you have a medical emergency, go to the nearest emergency room or call 911.  A surgeon from [facility] is always on call at the hospital. ° °For further questions, please visit [email or web address]  ° ° ° °Post Anesthesia Home Care Instructions ° °Activity: °Get plenty of rest for the remainder of the day. A responsible adult should stay with you for   24 hours following the procedure.  For the next 24 hours, DO NOT: -Drive a car -Paediatric nurse -Drink alcoholic beverages -Take any medication unless instructed by your physician -Make any legal decisions or sign important papers.  Meals: Start with liquid foods such as gelatin or soup. Progress to regular foods as tolerated. Avoid greasy, spicy,  heavy foods. If nausea and/or vomiting occur, drink only clear liquids until the nausea and/or vomiting subsides. Call your physician if vomiting continues.  Special Instructions/Symptoms: Your throat may feel dry or sore from the anesthesia or the breathing tube placed in your throat during surgery. If this causes discomfort, gargle with warm salt water. The discomfort should disappear within 24 hours.  Call your surgeon if you experience:   1.  Fever over 101.0. 2.  Inability to urinate. 3.  Nausea and/or vomiting. 4.  Extreme swelling or bruising at the surgical site. 5.  Continued bleeding from the incision. 6.  Increased pain, redness or drainage from the incision. 7.  Problems related to your pain medication.

## 2014-01-01 NOTE — Anesthesia Procedure Notes (Signed)
Procedure Name: LMA Insertion Date/Time: 01/01/2014 3:46 PM Performed by: Toula Moos L Pre-anesthesia Checklist: Patient identified, Emergency Drugs available, Suction available, Patient being monitored and Timeout performed Patient Re-evaluated:Patient Re-evaluated prior to inductionOxygen Delivery Method: Circle System Utilized Preoxygenation: Pre-oxygenation with 100% oxygen Intubation Type: IV induction Ventilation: Mask ventilation without difficulty LMA: LMA inserted LMA Size: 4.0 Number of attempts: 1 Airway Equipment and Method: bite block Placement Confirmation: positive ETCO2 and breath sounds checked- equal and bilateral Tube secured with: Tape Dental Injury: Teeth and Oropharynx as per pre-operative assessment

## 2014-01-01 NOTE — Interval H&P Note (Signed)
History and Physical Interval Note:  01/01/2014 3:35 PM  Connie [name]  has presented today for surgery, with the diagnosis of left breast cancer   The various methods of treatment have been discussed with the patient and family. After consideration of risks, benefits and other options for treatment, the patient has consented to  Procedure(s): RE-EXCISION OF left BREAST cancer  (Left) as a surgical intervention .  The patient's history has been reviewed, patient examined, no change in status, stable for surgery.  I have reviewed the patient's chart and labs.  Questions were answered to the patient's satisfaction.     [name]

## 2014-01-02 ENCOUNTER — Encounter (HOSPITAL_BASED_OUTPATIENT_CLINIC_OR_DEPARTMENT_OTHER): Payer: Self-pay | Admitting: General Surgery

## 2014-01-03 ENCOUNTER — Telehealth (INDEPENDENT_AMBULATORY_CARE_PROVIDER_SITE_OTHER): Payer: Self-pay

## 2014-01-03 NOTE — Progress Notes (Signed)
Quick Note:  Please let pt know final margins are clear! FB ______

## 2014-01-03 NOTE — Telephone Encounter (Signed)
Pt made aware pathology showed final margins clear.  No more surgery.

## 2014-01-09 ENCOUNTER — Ambulatory Visit (HOSPITAL_BASED_OUTPATIENT_CLINIC_OR_DEPARTMENT_OTHER): Payer: BC Managed Care – PPO | Admitting: Genetic Counselor

## 2014-01-09 ENCOUNTER — Other Ambulatory Visit: Payer: BC Managed Care – PPO

## 2014-01-09 DIAGNOSIS — C50919 Malignant neoplasm of unspecified site of unspecified female breast: Secondary | ICD-10-CM

## 2014-01-09 DIAGNOSIS — Z803 Family history of malignant neoplasm of breast: Secondary | ICD-10-CM

## 2014-01-09 DIAGNOSIS — C50319 Malignant neoplasm of lower-inner quadrant of unspecified female breast: Secondary | ICD-10-CM | POA: Insufficient documentation

## 2014-01-09 DIAGNOSIS — Z8481 Family history of carrier of genetic disease: Secondary | ICD-10-CM | POA: Insufficient documentation

## 2014-01-09 DIAGNOSIS — IMO0002 Reserved for concepts with insufficient information to code with codable children: Secondary | ICD-10-CM

## 2014-01-09 NOTE — Progress Notes (Signed)
HISTORY OF PRESENT ILLNESS: Dr. [name] requested a cancer genetics consultation for Connie [name], a 64 y.o. female, due to a personal and family history of breast cancer, and a deleterious BRCA2 mutation found in a paternal first cousin to Connie [name]. The mutation is called BRCA2, IVS5-2A>G.  Connie [name] presents to clinic today to discuss the possibility of a hereditary predisposition to cancer, genetic testing, and to further clarify her future cancer risks, as well as potential cancer risk for family members.   Connie [name] was recently diagnosed with a left breast cancer which is ER+/PR+/Her2NEU-. She is s/p left breast lumpectomy and is planning to meet with radiation oncology next week. She is at 25% risk for the familial BRCA2 mutation.  Past Medical History  Diagnosis Date   IBS (irritable bowel syndrome)    GERD (gastroesophageal reflux disease)    High cholesterol    Depression    PLMD (periodic limb movement disorder) 04/30/2013   PVC's (premature ventricular contractions) 04/30/2013   Bronchitis, chronic    Headache(784.0)     MIGRAINES   Meniscus tear     LEFT x 2   Arthritis     L FOOT   OSA (obstructive sleep apnea) 04/30/2013    USES C -PAP   Pneumonia    Anxiety    Cancer     left breast cancer   Diverticulitis    History of shingles Feb. 2015   Wears glasses     Past Surgical History  Procedure Laterality Date   Knee surgery Left 2000-2001     RT KNEE X1   Appendectomy  1984   Osteotomy      RT KNEE   Knee arthroscopy Left 08/28/2013    Procedure: LEFT KNEE ARTHROSCOPY WITH MEDIAL AND LATERAL DEBRIDEMENT;  Surgeon: Gearlean Alf, MD;  Location: [facility];  Service: Orthopedics;  Laterality: Left;   Dilation and curettage of uterus     Breast lumpectomy Left 12/05/2013    Procedure: BREAST LUMPECTOMY WITH EXCISION OF SENTINEL NODE;  Surgeon: [name], MD;  Location: [facility];  Service: General;  Laterality: Left;   Re-excision of  breast lumpectomy Left 01/01/2014    Procedure: RE-EXCISION OF left BREAST cancer ;  Surgeon: [name], MD;  Location: [facility];  Service: General;  Laterality: Left;    History   Social History   Marital Status: Married    Spouse Name: N/A    Number of Children: 4   Years of Education: college   Occupational History   [employer]    Social History Main Topics   Smoking status: Never Smoker    Smokeless tobacco: Never Used   Alcohol Use: Yes     Comment: SOCIALLY   Drug Use: No   Sexual Activity: Not on file   Other Topics Concern   Not on file   Social History Narrative   No narrative on file     FAMILY HISTORY:  During the visit, a 4-generation pedigree was obtained. Significant diagnoses include the following:  Family History  Problem Relation Age of Onset   Cancer Father 60    esophageal and prostate cancer - smoker   Cancer Paternal Aunt     breast at unknown age   72 Cousin 73    Breast cancer in a paternal first cousin; BRCA2 + , IVS5-2A>G. Tested at [facility] 55    breast cancer in a paternal first cousin (sister  to the cousin with the BRCA2 mutation)    Connie [name]'s ancestry is of and [place] European descent. There is no known Jewish ancestry or consanguinity.  GENETIC COUNSELING ASSESSMENT: Connie [name] is a 63 y.o. female with a personal and family history of breast cancer, and is also at risk for a BRCA2 mutation previously found on her paternal side of the family. We, therefore, discussed and recommended the following at today's visit.   DISCUSSION: We reviewed the characteristics, features and inheritance patterns of hereditary breast and ovarian cancer syndrome. We also discussed genetic testing, including the appropriate family members to test, the process of testing, insurance coverage and turn-around-time for results. We discussed the implications of a negative and positive result. We  recommended Connie [name] pursue site-specific genetic testing for the BRCA2 mutation called BRCA2, IVS5-2A>G.   PLAN: Based on our above recommendation, Connie [name] wished to pursue genetic testing and the blood sample was drawn and will be sent to [facility] for analysis. Results should be available within approximately 4 weeks time, at which point they will be disclosed by telephone to Connie [name], as will any additional recommendations warranted by these results. We also encouraged Connie [name] to remain in contact with cancer genetics annually so that we can continuously update the family history and inform her of any changes in cancer genetics and testing that may be of benefit for this family. Ms.  [name]'s questions were answered to her satisfaction today. Our contact information was provided should additional questions or concerns arise.   Thank you for the referral and allowing [place] to share in the care of your patient.   The patient was seen for a total of 40 minutes in face-to-face genetic counseling.  This patient was discussed with Dr. Jana Hakim who agrees with the above.    _______________________________________________________________________ For Office Staff:  Number of people involved in session: 3 Was an Intern/ student involved with case: no

## 2014-01-13 ENCOUNTER — Encounter: Payer: Self-pay | Admitting: *Deleted

## 2014-01-13 NOTE — Progress Notes (Signed)
Completed chart, labs entered, added to spreadsheet and placed in Dr. Magrinat's box.  

## 2014-01-14 ENCOUNTER — Encounter: Payer: Self-pay | Admitting: *Deleted

## 2014-01-14 ENCOUNTER — Other Ambulatory Visit: Payer: Self-pay | Admitting: *Deleted

## 2014-01-14 DIAGNOSIS — C50312 Malignant neoplasm of lower-inner quadrant of left female breast: Secondary | ICD-10-CM

## 2014-01-14 NOTE — Progress Notes (Signed)
Location of Breast Cancer: Left Breast Lower inner Quadrant   Histology per Pathology Report: Diagnosis 11/13/13: 1. Breast, left, needle core biopsy, mass, 8:30, 5 cm- INVASIVE DUCTAL CARCINOMA, SEE COMMENT.2. Breast, left, needle core biopsy, mass, 8:30, 5 cm, medial to dom mass- BENIGN BREAST TISSUE, SEE COMMENT. - NEGATIVE FOR ATYPIA OR MALIGNANCY.  Receptor Status: ER( + ), PR (+ ), Her2-neu (neg.)  Did patient present with symptoms (if so, please note symptoms) or was this found on screening mammography?: patient palpated a mass after Christmas this past year,  was having knee surgery,put it off   Past/Anticipated interventions by surgeon, if GNO:[id number] 01/01/14: Breast, excision, Left breast new medial margin- FIBROCYSTIC CHANGES AND BIOPSY SITE REACTION.- NO RESIDUAL TUMOR IDENTIFIED. - FOCAL LOBULAR NEOPLASIA (ATYPICAL LOBULAR HYPERPLASIA).- FINAL MARGINS CLEAR.  Diagnosis5/7/15: 1. Breast, lumpectomy, Left- INVASIVE DUCTAL CARCINOMA, SEE COMMENT.- INVASIVE TUMOR IS LESS THAN 0.1 MM FROM NEAREST MARGIN (INFERIOR).- NEGATIVE FOR LYMPH VASCULAR INVASION - DUCTAL CARCINOMA IN SITU.- IN SITU CARCINOMA IS FOCALLY PRESENT AT MEDIAL MARGIN. 1 of 4 FINAL for Benefiel, Perian A ([id number]) Diagnosis(continued) - PREVIOUS BIOPSY SITE.- SEE TUMOR SYNOPTIC TEMPLATE BELOW.2. Breast, excision, Left axilla new inferior margin, short stitch is new inferior- BENIGN BREAST TISSUE, SEE COMMENT.- NEGATIVE FOR ATYPIA OR MALIGNANCY.- SURGICAL MARGIN, NEGATIVE FOR ATYPIA OR MALIGNANCY.3. Lymph node, sentinel, biopsy, Left axillary # 1- ONE LYMPH NODE, NEGATIVE FOR TUMOR (0/1).4. Lymph node, sentinel, biopsy, Left axillary #2- ONE LYMPH NODE, NEGATIVE FOR TUMOR (0/1).5. Lymph node, biopsy, Left axillary- ONE LYMPH NODE, NEGATIVE FOR TUMOR (0/1). Dr. Barry Dienes, follow up s/p post check on 01/17/14  Past/Anticipated interventions by medical oncology, if any: Chemotherapy had genetic counseling , Oncotype results  =15, next appt Dr.Magrinat 67/21/15  Lymphedema issues, if any:No  Pain issues, if any:No  SAFETY ISSUES:  Prior radiation? No  Pacemaker/ICD? No  Possible current pregnancy? No  Is the patient on methotrexate?  No  Current Complaints / other details:  Married, 4 children works  [facility] Depression, OSA uses c-pap, PLMD(periodic limb movement disorder)  Non smoker,no smokeless tobacco, drinks alcohol socially, no illicit drug use Paternal  aunt breast cancer who was BRCA positive, two cousins (affected aunt's daughters) who had breast and /or ovarian cancer, Father esophageal & prostate cancer,smoker,  Menarche age 25, First child at age 48. Last menstrual cycle? HRT 6 years from 2009 to 2015     Rebecca Eaton, RN 01/14/2014,10:39 AM

## 2014-01-15 ENCOUNTER — Telehealth (INDEPENDENT_AMBULATORY_CARE_PROVIDER_SITE_OTHER): Payer: Self-pay

## 2014-01-15 NOTE — Telephone Encounter (Signed)
Pt is s/p lumpectomy on 01/01/14 by Dr. Barry Dienes.  She is calling because she hears "sloshing" in her breast.  Explained this was normal fluid buildup after surgery.  She is asymptomatic: no fever, redness or tightness at the breast, and no pain.  She has a f/u appt with Dr. Barry Dienes on 6/19.  Dr. Barry Dienes paged and said ok for the patient to wait until Friday.  Urged pt to call if any changes or increase in discomfort.  Pt understood and agreed.

## 2014-01-16 ENCOUNTER — Ambulatory Visit
Admission: RE | Admit: 2014-01-16 | Discharge: 2014-01-16 | Disposition: A | Discharge status: Home / Self Care | Payer: BC Managed Care – PPO | Source: Ambulatory Visit | Attending: Radiation Oncology | Admitting: Radiation Oncology

## 2014-01-16 ENCOUNTER — Encounter: Payer: Self-pay | Admitting: Radiation Oncology

## 2014-01-16 VITALS — BP 108/69 | HR 61 | Temp 98.3°F | Wt 203.1 lb

## 2014-01-16 DIAGNOSIS — C50319 Malignant neoplasm of lower-inner quadrant of unspecified female breast: Secondary | ICD-10-CM | POA: Insufficient documentation

## 2014-01-16 DIAGNOSIS — Z79899 Other long term (current) drug therapy: Secondary | ICD-10-CM | POA: Insufficient documentation

## 2014-01-16 DIAGNOSIS — Z7982 Long term (current) use of aspirin: Secondary | ICD-10-CM | POA: Diagnosis not present

## 2014-01-16 DIAGNOSIS — Z9089 Acquired absence of other organs: Secondary | ICD-10-CM | POA: Diagnosis not present

## 2014-01-16 DIAGNOSIS — F3289 Other specified depressive episodes: Secondary | ICD-10-CM | POA: Insufficient documentation

## 2014-01-16 DIAGNOSIS — G4761 Periodic limb movement disorder: Secondary | ICD-10-CM | POA: Diagnosis not present

## 2014-01-16 DIAGNOSIS — C50312 Malignant neoplasm of lower-inner quadrant of left female breast: Secondary | ICD-10-CM

## 2014-01-16 DIAGNOSIS — K219 Gastro-esophageal reflux disease without esophagitis: Secondary | ICD-10-CM | POA: Diagnosis not present

## 2014-01-16 DIAGNOSIS — F329 Major depressive disorder, single episode, unspecified: Secondary | ICD-10-CM | POA: Diagnosis not present

## 2014-01-16 DIAGNOSIS — Z510 Encounter for antineoplastic radiation therapy: Secondary | ICD-10-CM | POA: Diagnosis present

## 2014-01-16 DIAGNOSIS — M129 Arthropathy, unspecified: Secondary | ICD-10-CM | POA: Diagnosis not present

## 2014-01-16 DIAGNOSIS — E785 Hyperlipidemia, unspecified: Secondary | ICD-10-CM | POA: Diagnosis not present

## 2014-01-16 DIAGNOSIS — Z170 Estrogen receptor positive status [ER+]: Secondary | ICD-10-CM | POA: Insufficient documentation

## 2014-01-16 NOTE — Progress Notes (Signed)
Please see the Nurse Progress Note in the MD Initial Consult Encounter for this patient. 

## 2014-01-16 NOTE — Progress Notes (Signed)
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 female  who palpated a left breast mass. She saw a valuation and a mammogram confirmed a fixed mass in the 8:00 position. Ultrasound confirmed a 2.1 x 1.7 x 1.8 cm mass as well as another hypoechoic mass measuring about 7 mm. Other nodules measuring 8 mm and 4 mm were suspicious for satellite nodules. Biopsy was performed of 2 of these areas. The larger mass showed an invasive ductal carcinoma which was ER/PR positive HER-2 negative. A biopsy of the medial nodule was negative for malignancy. MRI of the bilateral breasts showed a 2.1 x 2.4 x 2.6 cm enhancing mass in the lower inner quadrant of the left breast. A second biopsy marker clip showed enhancement. No satellite masses were seen on MRI. No enlarged axillary lymph nodes. Her right breast was negative. She underwent lumpectomy on 12/05/2013 which showed a 2.9 cm invasive ductal carcinoma. Zero out of three lymph nodes were positive for malignancy. She had a close margin and went back for reexcision. The final margin was negative except for atypical lobular hyperplasia. Within the lumpectomy specimen several nodules were noted one including DCIS. She had genetic testing do to an aunt and several cousins with early onset breast and ovarian cancer and a known BRCA mutation. She had an Oncotype sent which showed a recurrence score of 15. She has a appointment with Dr. Jana Hakim next month. She is GX P4 her with menarche at 55 and her first child at the age of 89. She used hormone replacement for 6 years but has stopped taking that. 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 She is accompanied by  her husband.   PREVIOUS RADIATION THERAPY: No  PAST MEDICAL HISTORY:  has a past medical history of IBS (irritable bowel syndrome); GERD (gastroesophageal reflux disease); High cholesterol; Depression; PLMD (periodic limb movement disorder) (04/30/2013); PVC's (premature ventricular contractions) (04/30/2013); Bronchitis, chronic; Headache(784.0); Meniscus tear; Arthritis; OSA (obstructive sleep apnea) (04/30/2013); Pneumonia; Anxiety; Diverticulitis; History of shingles (Feb. 2015); Wears glasses; Cancer; and Breast cancer (11/13/13).    PAST SURGICAL HISTORY: Past Surgical History  Procedure Laterality Date  . Knee surgery Left 2000-2001     RT KNEE X1  . Appendectomy  1984  . Osteotomy      RT KNEE  . Knee arthroscopy Left 08/28/2013    Procedure: LEFT KNEE ARTHROSCOPY WITH MEDIAL AND LATERAL DEBRIDEMENT;  Surgeon: Gearlean Alf, MD;  Location: [facility];  Service: Orthopedics;  Laterality: Left;  . Dilation and curettage of uterus    . Breast lumpectomy Left 12/05/2013    Procedure: BREAST LUMPECTOMY WITH EXCISION OF SENTINEL NODE;  Surgeon: [name], MD;  Location: [place];  Service: General;  Laterality: Left;  . Re-excision of breast lumpectomy Left 01/01/2014    Procedure: RE-EXCISION OF left BREAST cancer ;  Surgeon: [name], MD;  Location: [place];  Service: General;  Laterality: Left;    FAMILY HISTORY:  Family History  Problem Relation Age of Onset  . Cancer Father 64    esophageal and prostate cancer - smoker  . Cancer Paternal Aunt     breast at unknown age  . Cancer  Cousin 52    Breast cancer in a paternal first cousin; BRCA2 + , IVS5-2A>G. Tested at [facility]  . Cancer Cousin 61    breast cancer in a paternal first cousin (sister to the cousin with the BRCA2 mutation)    SOCIAL HISTORY:  History  Substance Use Topics  . Smoking status: Never Smoker   . Smokeless tobacco: Never Used  . Alcohol Use: Yes     Comment: SOCIALLY    ALLERGIES:  Review of patient's allergies indicates no known allergies.  MEDICATIONS:  Current Outpatient Prescriptions  Medication Sig Dispense Refill  . aspirin 81 MG tablet Take 81 mg by mouth daily.      Marland Kitchen atenolol (TENORMIN) 25 MG tablet Take 25 mg by mouth every morning.       Marland Kitchen atorvastatin (LIPITOR) 40 MG tablet Take 40 mg by mouth every evening.       Marland Kitchen b complex vitamins tablet Take 1 tablet by mouth daily.      . butalbital-aspirin-caffeine (FIORINAL) 50-325-40 MG per capsule Take 1-2 capsules by mouth 2 (two) times daily as needed for headache.       . cholecalciferol (VITAMIN D) 1000 UNITS tablet Take 1,000 Units by mouth daily.      . cyclobenzaprine (FLEXERIL) 10 MG tablet Take 10 mg by mouth daily as needed for muscle spasms.      . diclofenac sodium (VOLTAREN) 1 % GEL Apply 4 g topically 4 (four) times daily as needed. pain      . diphenhydrAMINE (BENADRYL) 25 MG tablet Take 50 mg by mouth at bedtime.      . fluticasone (FLONASE) 50 MCG/ACT nasal spray       . glucosamine-chondroitin 500-400 MG tablet Take 1 tablet by mouth 2 (two) times daily.      Marland Kitchen HYDROcodone-acetaminophen (NORCO) 5-325 MG per tablet Take 1-2 tablets by mouth every 4 (four) hours as needed for moderate pain.  30 tablet  0  . magnesium oxide (MAG-OX) 400 MG tablet Take 400 mg by mouth daily.      . Multiple Vitamin (MULTIVITAMIN) tablet Take 1 tablet by mouth daily.      Marland Kitchen omega-3 acid ethyl esters (LOVAZA) 1 G capsule Take 1 g by mouth 2 (two) times daily.      Marland Kitchen omeprazole (PRILOSEC OTC) 20 MG tablet Take 20 mg by mouth daily.      . sertraline (ZOLOFT) 100 MG tablet Take 100 mg by mouth every evening.       Marland Kitchen UNABLE TO FIND [id number] post mastectomy camisole Qty: 2 L8000 Post-surgical bra: Qty: 6 C1660 Silicone Breast Prosthesis Qty: 1  Diagnosis Code 174.9  1 each     No current facility-administered medications for this encounter.    REVIEW OF SYSTEMS:  A 15 point review of systems is documented in the  electronic medical record. This was obtained by the nursing staff. However, I reviewed this with the patient to discuss relevant findings and make appropriate changes.  Pertinent items are noted in HPI.  PHYSICAL EXAM:  Filed Vitals:   01/16/14 0925  BP: 108/69  Pulse: 61  Temp: 98.3 F (36.8 C)  .203 lb 1.6 oz (92.126 kg). Pleasant female in no distress sitting comfortably examining table. She has a palpable seroma cavity which is soft in the inner quadrant of the left breast. Her scar is healing well.  LABORATORY DATA:  Lab Results  Component Value Date   WBC 6.7 12/03/2013   HGB  14.2 12/03/2013   HCT 41.7 12/03/2013   MCV 92.7 12/03/2013   PLT 218 12/03/2013   Lab Results  Component Value Date   NA 141 12/03/2013   K 4.5 12/03/2013   CL 103 12/03/2013   CO2 25 12/03/2013   Lab Results  Component Value Date   ALT 19 12/15/2008   AST 30 12/15/2008   ALKPHOS 68 12/15/2008   BILITOT 0.8 12/15/2008     RADIOGRAPHY: No results found.    IMPRESSION: T2 N0 invasive ductal carcinoma of the left breast  PLAN: I discussed with the patient and her husband her Oncotype score is. We discussed a low Oncotype score indicates a low chance of recurrence and low benefit from chemotherapy. I encouraged her to discuss this with Dr. Jana Hakim when she sees him in July as well. We discussed proceeding on with radiation at this time. She does have a quite large seroma cavity and is seeing Dr. Barry Dienes tomorrow. I would like to wait a couple more weeks so that she has some time for that seroma cavity to receipt if possible. I've therefore discussed with her that we'll likely plan on starting the second week of July. We'll bring her in for simulation the first week of July and then proceed on from there. We discussed the role of radiation in decreasing local failures in patients who undergo lumpectomy. We discussed the process of simulation and the placement tattoos. We discussed 4-6 weeks of treatment as an outpatient. We  discussed the possibility of asymptomatic lung damage. We discussed the low likelihood of secondary malignancies. We discussed the possible side effects including but not limited to skin redness, fatigue, permanent skin darkening, and breast swelling. I will discuss with Dr. Barry Dienes tomorrow and ensure that she has been cleared for radiation. I spent 60 minutes  face to face with the patient and more than 50% of that time was spent in counseling and/or coordination of care.   ------------------------------------------------  [name], MD

## 2014-01-17 ENCOUNTER — Ambulatory Visit (INDEPENDENT_AMBULATORY_CARE_PROVIDER_SITE_OTHER): Payer: BC Managed Care – PPO | Admitting: General Surgery

## 2014-01-17 ENCOUNTER — Encounter (INDEPENDENT_AMBULATORY_CARE_PROVIDER_SITE_OTHER): Payer: Self-pay | Admitting: General Surgery

## 2014-01-17 VITALS — BP 130/80 | HR 68 | Temp 97.9°F | Resp 14 | Ht 63.0 in | Wt 201.6 lb

## 2014-01-17 DIAGNOSIS — C50319 Malignant neoplasm of lower-inner quadrant of unspecified female breast: Secondary | ICD-10-CM

## 2014-01-17 DIAGNOSIS — C50312 Malignant neoplasm of lower-inner quadrant of left female breast: Secondary | ICD-10-CM

## 2014-01-17 NOTE — Progress Notes (Signed)
HISTORY: Pt is s/p lumpectomy, SLN bx, takeback for positive margins.  She noted that her breast was very sloshy.  She also feels like it may be warm.      EXAM: General:  Alert and oriented.   Incision:  Healing well.  Moderate seroma.  Some warmth, no real erythema.  50 mL serosang fluid aspirated.     PATHOLOGY: Breast, excision, Left breast new medial margin - FIBROCYSTIC CHANGES AND BIOPSY SITE REACTION. - NO RESIDUAL TUMOR IDENTIFIED. - FOCAL LOBULAR NEOPLASIA (ATYPICAL LOBULAR HYPERPLASIA). - FINAL MARGINS CLEAR.  ASSESSMENT AND PLAN:   Cancer of lower-inner quadrant of left female breast Seroma aspirated.  Recheck in 1.5-2 weeks.         Milus Height, MD Surgical Oncology, [facility]  Marton Redwood, MD No ref. provider found

## 2014-01-17 NOTE — Patient Instructions (Signed)
Follow up in around 1.5 weeks.

## 2014-01-17 NOTE — Assessment & Plan Note (Signed)
Seroma aspirated.  Recheck in 1.5-2 weeks.

## 2014-01-27 ENCOUNTER — Encounter (INDEPENDENT_AMBULATORY_CARE_PROVIDER_SITE_OTHER): Payer: BC Managed Care – PPO | Admitting: General Surgery

## 2014-01-28 ENCOUNTER — Ambulatory Visit
Admission: RE | Admit: 2014-01-28 | Discharge: 2014-01-28 | Disposition: A | Discharge status: Home / Self Care | Payer: BC Managed Care – PPO | Source: Ambulatory Visit | Attending: Radiation Oncology | Admitting: Radiation Oncology

## 2014-01-28 DIAGNOSIS — Z510 Encounter for antineoplastic radiation therapy: Secondary | ICD-10-CM | POA: Diagnosis not present

## 2014-01-28 DIAGNOSIS — C50312 Malignant neoplasm of lower-inner quadrant of left female breast: Secondary | ICD-10-CM

## 2014-01-28 NOTE — Progress Notes (Signed)
Radiation Oncology         [phone] ________________________________  Name: Connie [name]      MRN: [id number]          Date: 01/28/2014              DOB: March 31, 1951  Optical Surface Tracking Plan:  Since intensity modulated radiotherapy (IMRT) and 3D conformal radiation treatment methods are predicated on accurate and precise positioning for treatment, intrafraction motion monitoring is medically necessary to ensure accurate and safe treatment delivery.  The ability to quantify intrafraction motion without excessive ionizing radiation dose can only be performed with optical surface tracking. Accordingly, surface imaging offers the opportunity to obtain 3D measurements of patient position throughout IMRT and 3D treatments without excessive radiation exposure.  I am ordering optical surface tracking for this patient's upcoming course of radiotherapy. ________________________________ Signature   Reference:   Ursula Alert, J, et al. Surface imaging-based analysis of intrafraction motion for breast radiotherapy patients.Journal of [place], n. 6, nov. 2014. ISSN [id number].   Available at: <http://www.jacmp.org/index.php/jacmp/article/view/4957>.

## 2014-01-28 NOTE — Addendum Note (Signed)
Encounter addended by: [name], MD on: 01/28/2014  5:40 PM<BR>     Documentation filed: Visit Diagnoses, Notes Section

## 2014-01-28 NOTE — Progress Notes (Signed)
Name: Connie [name]   MRN: [id number]  Date:  01/28/2014  DOB: October 03, 1950  Status:outpatient   DIAGNOSIS: Left Breast cancer.  CONSENT VERIFIED: yes SET UP: Patient is setup supine  IMMOBILIZATION:  The following immobilization was used:Custom Moldable Pillow, breast board.  NARRATIVE: [name] was brought to the [facility].  Identity was confirmed.  All relevant records and images related to the planned course of therapy were reviewed.  Then, the patient was positioned in a stable reproducible clinical set-up for radiation therapy.  Wires were placed to delineate the clinical extent of breast tissue. A wire was placed on the scar as well.  CT images were obtained.  An isocenter was placed. Skin markings were placed.  The position of the heart was then analyzed.  Due to the proximity of the heart to the chest wall, I felt she would benefit from deep inspiration breath hold for cardiac sparing.  She was then coached and rescanned in the breath hold position.  Acceptable cardiac sparing was achieved. The CT images were loaded into the planning software where the target and avoidance structures were contoured.  The radiation prescription was entered and confirmed. The patient was discharged in stable condition and tolerated simulation well.    TREATMENT PLANNING NOTE/3D Simulation Note Treatment planning then occurred. I have requested : MLC's, isodose plan, basic dose calculation  3D simulation was performed.  I personally designed and supervised the construction of 3 medically necessary complex treatment devices in the form of MLCs which will be used for beam modification and to protect critical structures including the heart and lung as well as the immobilization device which is necessary for reproducible set up.  I have requested a dose volume histogram of the heart, lung and tumor cavity.    RESPIRATORY MOTION MANAGEMENT SIMULATION - Deep Inspiration Breath  Hold  NARRATIVE:  In order to account for effect of respiratory motion on target structures and other organs in the planning and delivery of radiotherapy, this patient underwent respiratory motion management simulation.  To accomplish this, when the patient was brought to the CT simulation planning suite, a bellows was placed on the her abdomen.  Wave forms of the patient's breathing were obtained. Coaching was performed and practice sessions initiated to monitor her ability to obtain and maintain deep inspiration breath hold.  The CT images were loaded into the planning software and fused with her free breathing images by physics.  Acceptable cardiac sparing was achieved through the use of deep inspiration breath hold.  Planning will be performed on her breath hold scan

## 2014-01-29 ENCOUNTER — Ambulatory Visit (INDEPENDENT_AMBULATORY_CARE_PROVIDER_SITE_OTHER): Payer: BC Managed Care – PPO | Admitting: General Surgery

## 2014-01-29 ENCOUNTER — Encounter (INDEPENDENT_AMBULATORY_CARE_PROVIDER_SITE_OTHER): Payer: Self-pay | Admitting: General Surgery

## 2014-01-29 VITALS — BP 110/80 | HR 66 | Temp 98.2°F | Resp 14 | Ht 64.0 in | Wt 202.2 lb

## 2014-01-29 DIAGNOSIS — C50319 Malignant neoplasm of lower-inner quadrant of unspecified female breast: Secondary | ICD-10-CM

## 2014-01-29 DIAGNOSIS — C50312 Malignant neoplasm of lower-inner quadrant of left female breast: Secondary | ICD-10-CM

## 2014-01-29 NOTE — Progress Notes (Signed)
HISTORY: Pt is s/p lumpectomy with SLN and takeback for margins.  She had a fairly large seroma.  She is here for recheck.  She is not having any pain or fevers.      EXAM: General:  Alert and oriented.   Incision:  Healing well.  Seroma much decreased since last exam.  No erythema.     ASSESSMENT AND PLAN:   Cancer of lower-inner quadrant of left female breast Seroma improved. OK for radiation.         Milus Height, MD Surgical Oncology, [facility]  Marton Redwood, MD No ref. provider found

## 2014-01-29 NOTE — Assessment & Plan Note (Signed)
Seroma improved. OK for radiation.

## 2014-01-30 DIAGNOSIS — Z510 Encounter for antineoplastic radiation therapy: Secondary | ICD-10-CM | POA: Diagnosis not present

## 2014-02-04 DIAGNOSIS — Z510 Encounter for antineoplastic radiation therapy: Secondary | ICD-10-CM | POA: Diagnosis not present

## 2014-02-05 ENCOUNTER — Ambulatory Visit
Admission: RE | Admit: 2014-02-05 | Discharge: 2014-02-05 | Disposition: A | Discharge status: Home / Self Care | Payer: BC Managed Care – PPO | Source: Ambulatory Visit | Attending: Radiation Oncology | Admitting: Radiation Oncology

## 2014-02-05 DIAGNOSIS — C50312 Malignant neoplasm of lower-inner quadrant of left female breast: Secondary | ICD-10-CM

## 2014-02-05 DIAGNOSIS — Z510 Encounter for antineoplastic radiation therapy: Secondary | ICD-10-CM | POA: Diagnosis not present

## 2014-02-06 ENCOUNTER — Ambulatory Visit
Admission: RE | Admit: 2014-02-06 | Discharge: 2014-02-06 | Disposition: A | Discharge status: Home / Self Care | Payer: BC Managed Care – PPO | Source: Ambulatory Visit | Attending: Radiation Oncology | Admitting: Radiation Oncology

## 2014-02-06 DIAGNOSIS — Z510 Encounter for antineoplastic radiation therapy: Secondary | ICD-10-CM | POA: Diagnosis not present

## 2014-02-06 NOTE — Progress Notes (Signed)
  Radiation Oncology         [phone] ________________________________  Name: Connie [name] MRN: [id number]  Date: 02/05/2014  DOB: 06/09/1951  Simulation Verification Note  Status: outpatient  NARRATIVE: The patient was brought to the treatment unit and placed in the planned treatment position. The clinical setup was verified. Then port films were obtained and uploaded to the radiation oncology medical record software.  The treatment beams were carefully compared against the planned radiation fields. The position location and shape of the radiation fields was reviewed. The targeted volume of tissue appears appropriately covered by the radiation beams. Organs at risk appear to be excluded as planned.  Based on my personal review, I approved the simulation verification. The patient's treatment will proceed as planned.  ------------------------------------------------  [name], MD

## 2014-02-07 ENCOUNTER — Ambulatory Visit
Admission: RE | Admit: 2014-02-07 | Discharge: 2014-02-07 | Disposition: A | Discharge status: Home / Self Care | Payer: BC Managed Care – PPO | Source: Ambulatory Visit | Attending: Radiation Oncology | Admitting: Radiation Oncology

## 2014-02-07 DIAGNOSIS — Z510 Encounter for antineoplastic radiation therapy: Secondary | ICD-10-CM | POA: Diagnosis not present

## 2014-02-10 ENCOUNTER — Ambulatory Visit
Admission: RE | Admit: 2014-02-10 | Discharge: 2014-02-10 | Disposition: A | Discharge status: Home / Self Care | Payer: BC Managed Care – PPO | Source: Ambulatory Visit | Attending: Radiation Oncology | Admitting: Radiation Oncology

## 2014-02-10 DIAGNOSIS — Z510 Encounter for antineoplastic radiation therapy: Secondary | ICD-10-CM | POA: Diagnosis not present

## 2014-02-11 ENCOUNTER — Ambulatory Visit
Admission: RE | Admit: 2014-02-11 | Discharge: 2014-02-11 | Disposition: A | Discharge status: Home / Self Care | Payer: BC Managed Care – PPO | Source: Ambulatory Visit | Attending: Radiation Oncology | Admitting: Radiation Oncology

## 2014-02-11 VITALS — BP 119/82 | HR 80 | Temp 98.1°F | Wt 206.2 lb

## 2014-02-11 DIAGNOSIS — Z510 Encounter for antineoplastic radiation therapy: Secondary | ICD-10-CM | POA: Diagnosis not present

## 2014-02-11 DIAGNOSIS — C50312 Malignant neoplasm of lower-inner quadrant of left female breast: Secondary | ICD-10-CM

## 2014-02-11 NOTE — Progress Notes (Signed)
Patient for weekly assessment of radiation to  Left breast.Has completed 4 treatments.reviewed routine of clinic and side effects of treatment using teach back method patient able to verbalize at least 2 side effects of care.Given Radiation Therapy and You Booklet, skin care sheet, alra deodorant and radiaplex.Informed not to have anything on treatment field 3 to 4 hours prior to treatment.

## 2014-02-11 NOTE — Progress Notes (Signed)
Weekly Management Note Current Dose: 7.2  Gy  Projected Dose: 61 Gy   Narrative:  The patient presents for routine under treatment assessment.  CBCT/MVCT images/Port film x-rays were reviewed.  The chart was checked. Doing well. Changed to Pristiq by PCP for hot flashes.   Physical Findings: Weight: 206 lb 3.2 oz (93.532 kg). No skin changes.  Impression:  The patient is tolerating radiation.  Plan:  Continue treatment as planned. Start radiaplex.

## 2014-02-12 ENCOUNTER — Ambulatory Visit
Admission: RE | Admit: 2014-02-12 | Discharge: 2014-02-12 | Disposition: A | Discharge status: Home / Self Care | Payer: BC Managed Care – PPO | Source: Ambulatory Visit | Attending: Radiation Oncology | Admitting: Radiation Oncology

## 2014-02-12 DIAGNOSIS — Z510 Encounter for antineoplastic radiation therapy: Secondary | ICD-10-CM | POA: Diagnosis not present

## 2014-02-13 ENCOUNTER — Encounter: Payer: Self-pay | Admitting: Genetic Counselor

## 2014-02-13 ENCOUNTER — Ambulatory Visit
Admission: RE | Admit: 2014-02-13 | Discharge: 2014-02-13 | Disposition: A | Discharge status: Home / Self Care | Payer: BC Managed Care – PPO | Source: Ambulatory Visit | Attending: Radiation Oncology | Admitting: Radiation Oncology

## 2014-02-13 DIAGNOSIS — Z803 Family history of malignant neoplasm of breast: Secondary | ICD-10-CM

## 2014-02-13 DIAGNOSIS — Z510 Encounter for antineoplastic radiation therapy: Secondary | ICD-10-CM | POA: Diagnosis not present

## 2014-02-13 DIAGNOSIS — Z8481 Family history of carrier of genetic disease: Secondary | ICD-10-CM

## 2014-02-13 NOTE — Progress Notes (Addendum)
HPI:  Ms. Verley was previously seen in the [facility] due to a personal and family history of cancer, a recently identified BRCA2 gene mutation identified in her family and concerns regarding a hereditary predisposition to cancer. Please refer to our prior cancer genetics clinic note for more information regarding Ms. Vandeventer's medical, social and family histories, and our assessment and recommendations, at the time. Ms. Stoecker's recent genetic test results were disclosed to her, as were recommendations warranted by these results. These results and recommendations are discussed in more detail below.  GENETIC TEST RESULTS: At the time of Ms. Hanratty's visit, we recommended Ms. Snelgrove pursue testing for the familial gene mutation called BRCA2, c.476-2A>G (IVS5-2A>G). Ms. Crittendon's test, which was performed at [facility], did not reveal this mutation previously identified in her family. Although Ms. Cinque has a personal diagnosis of breast cancer at age 63, it is likely her diagnosis is a phenocopy in the family and her diagnosis of breast cancer happened by chance. We, therefore, recommended she continue to follow the cancer screening management and screening guidelines provided by her oncology and primary providers.   RECOMMENDATIONS FOR FAMILY MEMBERS:  Even though Ms. Rosenboom did not inherit the familial gene mutation, others in the family may have.  It is very important all of Ms. Brokaw's relatives (female and female) know of the presence of this mutation and that they may be at increased risk for cancer.  Genetic testing can sort out who in your family is at risk and who is not.  Please let us know if we can help facilitate testing. Genetic counselors can be located in other cities, by visiting the website of the [employer] (ArtistMovie.se) and Field seismologist for a Dietitian by zip code.  FOLLOW-UP: Lastly, we discussed  with Ms. Lutz that cancer genetics is a rapidly advancing field and it is possible that new genetic tests will be appropriate for her and/or her family members in the future. We encouraged her to remain in contact with cancer genetics on an annual basis so we can update her personal and family histories and let her know of advances in cancer genetics that may benefit this family.   Our contact number was provided. Ms. Bessent's questions were answered to her satisfaction, and she knows she is welcome to call us at anytime with additional questions or concerns. This patient was discussed with Dr. Barry Dienes who agrees with the above.   Catherine A. Fine, MS, CGC Certified Genetic Counseor phone: [phone] [email or web address]

## 2014-02-14 ENCOUNTER — Ambulatory Visit
Admission: RE | Admit: 2014-02-14 | Discharge: 2014-02-14 | Disposition: A | Discharge status: Home / Self Care | Payer: BC Managed Care – PPO | Source: Ambulatory Visit | Attending: Radiation Oncology | Admitting: Radiation Oncology

## 2014-02-14 DIAGNOSIS — Z510 Encounter for antineoplastic radiation therapy: Secondary | ICD-10-CM | POA: Diagnosis not present

## 2014-02-17 ENCOUNTER — Ambulatory Visit
Admission: RE | Admit: 2014-02-17 | Discharge: 2014-02-17 | Disposition: A | Discharge status: Home / Self Care | Payer: BC Managed Care – PPO | Source: Ambulatory Visit | Attending: Radiation Oncology | Admitting: Radiation Oncology

## 2014-02-17 DIAGNOSIS — Z510 Encounter for antineoplastic radiation therapy: Secondary | ICD-10-CM | POA: Diagnosis not present

## 2014-02-18 ENCOUNTER — Ambulatory Visit (HOSPITAL_BASED_OUTPATIENT_CLINIC_OR_DEPARTMENT_OTHER): Payer: BC Managed Care – PPO | Admitting: Oncology

## 2014-02-18 ENCOUNTER — Other Ambulatory Visit (HOSPITAL_BASED_OUTPATIENT_CLINIC_OR_DEPARTMENT_OTHER): Payer: BC Managed Care – PPO

## 2014-02-18 ENCOUNTER — Ambulatory Visit
Admission: RE | Admit: 2014-02-18 | Discharge: 2014-02-18 | Disposition: A | Discharge status: Home / Self Care | Payer: BC Managed Care – PPO | Source: Ambulatory Visit | Attending: Radiation Oncology | Admitting: Radiation Oncology

## 2014-02-18 ENCOUNTER — Ambulatory Visit: Payer: BC Managed Care – PPO

## 2014-02-18 VITALS — BP 115/71 | HR 78 | Temp 98.0°F | Wt 204.2 lb

## 2014-02-18 VITALS — BP 128/73 | HR 75 | Temp 98.2°F | Resp 20 | Ht 64.0 in | Wt 203.1 lb

## 2014-02-18 DIAGNOSIS — C50312 Malignant neoplasm of lower-inner quadrant of left female breast: Secondary | ICD-10-CM

## 2014-02-18 DIAGNOSIS — C50319 Malignant neoplasm of lower-inner quadrant of unspecified female breast: Secondary | ICD-10-CM

## 2014-02-18 DIAGNOSIS — Z510 Encounter for antineoplastic radiation therapy: Secondary | ICD-10-CM | POA: Diagnosis not present

## 2014-02-18 DIAGNOSIS — Z170 Estrogen receptor positive status [ER+]: Secondary | ICD-10-CM

## 2014-02-18 LAB — COMPREHENSIVE METABOLIC PANEL (CC13)
ALT: 30 U/L (ref 0–55)
AST: 24 U/L (ref 5–34)
Albumin: 3.8 g/dL (ref 3.5–5.0)
Alkaline Phosphatase: 96 U/L (ref 40–150)
Anion Gap: 11 mEq/L (ref 3–11)
BUN: 14.3 mg/dL (ref 7.0–26.0)
CALCIUM: 10.1 mg/dL (ref 8.4–10.4)
CHLORIDE: 102 meq/L (ref 98–109)
CO2: 28 meq/L (ref 22–29)
Creatinine: 0.9 mg/dL (ref 0.6–1.1)
Glucose: 91 mg/dl (ref 70–140)
Potassium: 4.1 mEq/L (ref 3.5–5.1)
Sodium: 141 mEq/L (ref 136–145)
Total Bilirubin: 0.3 mg/dL (ref 0.20–1.20)
Total Protein: 7.1 g/dL (ref 6.4–8.3)

## 2014-02-18 LAB — CBC WITH DIFFERENTIAL/PLATELET
BASO%: 0.3 % (ref 0.0–2.0)
BASOS ABS: 0 10*3/uL (ref 0.0–0.1)
EOS ABS: 0.2 10*3/uL (ref 0.0–0.5)
EOS%: 3.6 % (ref 0.0–7.0)
HEMATOCRIT: 41.5 % (ref 34.8–46.6)
HEMOGLOBIN: 14.4 g/dL (ref 11.6–15.9)
LYMPH#: 1.8 10*3/uL (ref 0.9–3.3)
LYMPH%: 26.8 % (ref 14.0–49.7)
MCH: 31.4 pg (ref 25.1–34.0)
MCHC: 34.7 g/dL (ref 31.5–36.0)
MCV: 90.6 fL (ref 79.5–101.0)
MONO#: 0.7 10*3/uL (ref 0.1–0.9)
MONO%: 10.5 % (ref 0.0–14.0)
NEUT%: 58.8 % (ref 38.4–76.8)
NEUTROS ABS: 3.9 10*3/uL (ref 1.5–6.5)
PLATELETS: 234 10*3/uL (ref 145–400)
RBC: 4.58 10*6/uL (ref 3.70–5.45)
RDW: 13.1 % (ref 11.2–14.5)
WBC: 6.7 10*3/uL (ref 3.9–10.3)
nRBC: 0 % (ref 0–0)

## 2014-02-18 MED ORDER — TAMOXIFEN CITRATE 20 MG PO TABS: 20.0000 mg | ORAL_TABLET | Freq: Every day | ORAL | Status: DC

## 2014-02-18 NOTE — Progress Notes (Signed)
[place]  [phone] [phone]     ID: Connie [name] DOB: 10/23/1950  MR#: [id number]  PXT#:[id number]  PCP: Marton Redwood, MD GYN: [name] SU: [name] OTHER MD: [name]. [name]  CHIEF COMPLAINT: Estrogen receptor positive breast cancer  CURRENT TREATMENT: Adjuvant radiation   BREAST CANCER HISTORY: The patient herself palpated a mass in her left breast around Christmastime 2014 but was about to undergo knee surgery at the time and did not immediately followup on this. On 11/06/2013 she brought it to Dr. [name] attention and he scheduled her for bilateral diagnostic mammography and left ultrasonography at the breast Center. This showed a 2.4 cm mass in the left lower inner quadrant, with lobulated margins. This was palpable and appeared fixed. Ultrasound confirmed an irregular hypoechoic solid mass at the 8:30 position 5 cm from the nipple measuring 2.1 cm. Medial to this was an oval hypoechoic mass measuring 0.7 cm and inferior to this there were at least 2 additional hypoechoic nodules, the largest measuring 0.8 cm. Ultrasound of the left axilla was negative.  Ultrasound-guided biopsy of the 2 more prominent left breast masses 11/13/2013 showed ([id number]) one of the masses to be benign. The other one was an invasive ductal carcinoma, grade 1 estrogen receptor 100% positive, progesterone receptor 91% positive, both with strong staining intensity, with an MIB-1 of 89%, and no HER-2 amplification, the signals ratio being 0.93 and the number per cell 1.95.  On 11/21/2013 the patient underwent bilateral breast MRI. This showed, in the 8:00 position of the left breast, a 2.6 cm enhancing mass with irregular borders. Lateral to this there was a second biopsy marker clip surrounded by minimal enhancement. There was no mass in that area. Ultrasound had shown evidence of possible satellite masses inferior to the dominant mass but  these were not seen by MRI. There were no other findings, the right breast was normal, and the left axilla showed no abnormal notes.  On 12/05/2013 the patient underwent left lumpectomy and sentinel lymph node sampling. This showed a 2.9 cm invasive ductal carcinoma, grade 3, less than a millimeter from the closest margin. 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 Estrogen receptor and progesterone receptor studies were obtained on the DCIS and are positive at 100% for the estrogen receptor and 39%, progesterone receptor, both with strong staining intensity.  On 01/01/2014 the patient underwent additional surgery for margin clearance. This showed no residual tumor.  In addition, an Oncotype scan was obtained from the invasive tumor (lumpectomy specimen) and showed a score of 15, predicting a ten-year risk of outside the breast recurrence of 10% if the patient's only systemic treatment was tamoxifen for 5 years. It also predicted no benefit from chemotherapy.  The patient was evaluated by Dr. Pablo Ledger and started radiation treatments 02/11/2014. She also met with genetics, since there was a documented BRCA 2 mutation in a first cousin. The patient was tested specifically for this mutation, and was found to be negative.  Her subsequent history is as detailed below  INTERVAL HISTORY: Connie [name] was evaluated in the breast clinic 02/18/2014 accompanied by her husband [place]: She did well with the surgery, although it was complicated by a seroma requiring drainage. She is so far tolerating the radiation with no significant side effects. She is having significant hot flashes: She was only taken off hormone replacement in April, at the time of the breast cancer diagnosis.  She sleeps on 2 pillows and uses CPAP at night. She has some stress urinary incontinence. She has hammertoes and some arthritis pains here in there, not more persistent or intense than  prior. She has a history of depression. A detailed review of systems today was otherwise noncontributory  PAST MEDICAL HISTORY: Past Medical History  Diagnosis Date  . IBS (irritable bowel syndrome)   . GERD (gastroesophageal reflux disease)   . High cholesterol   . Depression   . PLMD (periodic limb movement disorder) 04/30/2013  . PVC's (premature ventricular contractions) 04/30/2013  . Bronchitis, chronic   . Headache(784.0)     MIGRAINES  . Meniscus tear     LEFT x 2  . Arthritis     L FOOT  . OSA (obstructive sleep apnea) 04/30/2013    USES C -PAP  . Pneumonia   . Anxiety   . Diverticulitis   . History of shingles Feb. 2015  . Wears glasses   . Cancer     left breast cancer  . Breast cancer 11/13/13    left breast invasive ductal ca,    PAST SURGICAL HISTORY: Past Surgical History  Procedure Laterality Date  . Knee surgery Left 2000-2001     RT KNEE X1  . Appendectomy  1984  . Osteotomy      RT KNEE  . Knee arthroscopy Left 08/28/2013    Procedure: LEFT KNEE ARTHROSCOPY WITH MEDIAL AND LATERAL DEBRIDEMENT;  Surgeon: Gearlean Alf, MD;  Location: [facility];  Service: Orthopedics;  Laterality: Left;  . Dilation and curettage of uterus    . Breast lumpectomy Left 12/05/2013    Procedure: BREAST LUMPECTOMY WITH EXCISION OF SENTINEL NODE;  Surgeon: [name], MD;  Location: [facility];  Service: General;  Laterality: Left;  . Re-excision of breast lumpectomy Left 01/01/2014    Procedure: RE-EXCISION OF left BREAST cancer ;  Surgeon: [name], MD;  Location: [facility];  Service: General;  Laterality: Left;    FAMILY HISTORY Family History  Problem Relation Age of Onset  . Cancer Father 57    esophageal and prostate cancer - smoker  . Cancer Paternal Aunt     breast at unknown age  . Cancer Cousin 46    Breast cancer in a paternal first cousin; BRCA2 + , IVS5-2A>G. Tested at [facility]  . Cancer Cousin 62    breast cancer in a paternal first cousin  (sister to the cousin with the BRCA2 mutation)   the patient's father died from esophageal cancer at age 75. The patient's mother is living, at age 81. The patient had one brother, 2 sisters. One of the patient's father's 2 sisters was diagnosed with breast cancer and that aunt has 3 daughters, one of whom had breast cancer at an early age. The daughter was tested and was found to carry a BRCA2 mutation which [place] however does not carry.  GYNECOLOGIC HISTORY:  No LMP recorded. Patient is postmenopausal. Menarche age 15, first live birth age 77, which the patient understands increases the risk of breast cancer. She stopped having periods approximately 10 years ago and has been on hormone replacement since that time, stopping in April of 2015.  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 Her husband Frederico Hamman worked as an Clinical biochemist. He also recently retired. This is his second marriage for both of them. She  has 2 children from her first marriage, Gaylord Shih, who works in [employer] in [place], and [employer], who works in Pharmacologist and lives in [place]. Frederico Hamman has 2 children, Anderson Malta lives in [place] and [place] who lives in [place]. Anderson Malta is a stay-at-home mom Wendi Maya is a Education officer, museum. They have one grandson, who lives in [place]: In place   HEALTH MAINTENANCE: History  Substance Use Topics  . Smoking status: Never Smoker   . Smokeless tobacco: Never Used  . Alcohol Use: Yes     Comment: SOCIALLY     Colonoscopy: 2009, under lab our. She also had EGD at the same time given her father's history of esophageal cancer  PAP: 2014  Bone density: 2014 at [facility]; lowest T score was -0.9 (normal)  Lipid panel:  No Known Allergies  Current Outpatient Prescriptions  Medication Sig Dispense Refill  . aspirin 81 MG tablet  Take 81 mg by mouth daily.      Marland Kitchen atenolol (TENORMIN) 25 MG tablet Take 25 mg by mouth every morning.       Marland Kitchen atorvastatin (LIPITOR) 40 MG tablet Take 40 mg by mouth every evening.       Marland Kitchen b complex vitamins tablet Take 1 tablet by mouth daily.      . butalbital-aspirin-caffeine (FIORINAL) 50-325-40 MG per capsule Take 1-2 capsules by mouth 2 (two) times daily as needed for headache.       . cholecalciferol (VITAMIN D) 1000 UNITS tablet Take 5,000 Units by mouth daily.       . cyclobenzaprine (FLEXERIL) 10 MG tablet Take 10 mg by mouth daily as needed for muscle spasms.      Marland Kitchen desvenlafaxine (PRISTIQ) 50 MG 24 hr tablet Take 50 mg by mouth daily.      . diclofenac sodium (VOLTAREN) 1 % GEL Apply 4 g topically 4 (four) times daily as needed. pain      . diphenhydrAMINE (BENADRYL) 25 MG tablet Take 50 mg by mouth at bedtime.      . fluticasone (FLONASE) 50 MCG/ACT nasal spray       . glucosamine-chondroitin 500-400 MG tablet Take 1 tablet by mouth 2 (two) times daily.      . hyaluronate sodium (RADIAPLEXRX) GEL Apply 1 application topically 2 (two) times daily. Apply to breast area after rad tx and bedtime daily & prn      . HYDROcodone-acetaminophen (NORCO) 5-325 MG per tablet Take 1-2 tablets by mouth every 4 (four) hours as needed for moderate pain.  30 tablet  0  . hyoscyamine (LEVSIN, ANASPAZ) 0.125 MG tablet       . magnesium oxide (MAG-OX) 400 MG tablet Take 400 mg by mouth daily.      . Multiple Vitamin (MULTIVITAMIN) tablet Take 1 tablet by mouth daily.      . non-metallic deodorant Jethro Poling) MISC Apply 1 application topically daily as needed.      Marland Kitchen omega-3 acid ethyl esters (LOVAZA) 1 G capsule Take 1 g by mouth 2 (two) times daily.      Marland Kitchen omeprazole (PRILOSEC OTC) 20 MG tablet Take 20 mg by mouth daily.      . sertraline (ZOLOFT) 100 MG tablet       . UNABLE TO FIND [id number] post mastectomy camisole Qty: 2 L8000 Post-surgical bra: Qty: 6 U2725 Silicone Breast Prosthesis Qty: 1  Diagnosis  Code 174.9  1 each    . tamoxifen (NOLVADEX) 20 MG tablet Take 1  tablet (20 mg total) by mouth daily.  90 tablet  12   No current facility-administered medications for this visit.    OBJECTIVE: Middle-aged white woman who appears stated age 28 Vitals:   02/18/14 1618  BP: 128/73  Pulse: 75  Temp: 98.2 F (36.8 C)  Resp: 20     Body mass index is 34.85 kg/(m^2).    ECOG FS:1 - Symptomatic but completely ambulatory  Ocular: Sclerae unicteric, pupils equal, round and reactive to light Ear-nose-throat: Oropharynx clear, dentition fair Lymphatic: No cervical or supraclavicular adenopathy Lungs no rales or rhonchi, good excursion bilaterally Heart regular rate and rhythm, no murmur appreciated Abd soft, nontender, positive bowel sounds MSK no focal spinal tenderness, no joint edema Neuro: non-focal, well-oriented, appropriate affect Breasts: The right breast is unremarkable. The left breast is status post lumpectomy and is currently receiving radiation. There is a minimal blush. There are no palpable masses. Both nipples are flat. The left axilla is benign.   LAB RESULTS:  CMP     Component Value Date/Time   NA 141 02/18/2014 1605   NA 141 12/03/2013 0939   K 4.1 02/18/2014 1605   K 4.5 12/03/2013 0939   CL 103 12/03/2013 0939   CO2 28 02/18/2014 1605   CO2 25 12/03/2013 0939   GLUCOSE 91 02/18/2014 1605   GLUCOSE 109* 12/03/2013 0939   BUN 14.3 02/18/2014 1605   BUN 12 12/03/2013 0939   CREATININE 0.9 02/18/2014 1605   CREATININE 0.79 12/03/2013 0939   CALCIUM 10.1 02/18/2014 1605   CALCIUM 9.8 12/03/2013 0939   PROT 7.1 02/18/2014 1605   PROT 7.0 12/15/2008 0743   ALBUMIN 3.8 02/18/2014 1605   ALBUMIN 3.8 12/15/2008 0743   AST 24 02/18/2014 1605   AST 30 12/15/2008 0743   ALT 30 02/18/2014 1605   ALT 19 12/15/2008 0743   ALKPHOS 96 02/18/2014 1605   ALKPHOS 68 12/15/2008 0743   BILITOT 0.30 02/18/2014 1605   BILITOT 0.8 12/15/2008 0743   GFRNONAA 88* 12/03/2013 0939   GFRAA >90 12/03/2013 0939     I No results found for this basename: SPEP,  UPEP,   kappa and lambda light chains    Lab Results  Component Value Date   WBC 6.7 02/18/2014   NEUTROABS 3.9 02/18/2014   HGB 14.4 02/18/2014   HCT 41.5 02/18/2014   MCV 90.6 02/18/2014   PLT 234 02/18/2014      Chemistry      Component Value Date/Time   NA 141 02/18/2014 1605   NA 141 12/03/2013 0939   K 4.1 02/18/2014 1605   K 4.5 12/03/2013 0939   CL 103 12/03/2013 0939   CO2 28 02/18/2014 1605   CO2 25 12/03/2013 0939   BUN 14.3 02/18/2014 1605   BUN 12 12/03/2013 0939   CREATININE 0.9 02/18/2014 1605   CREATININE 0.79 12/03/2013 0939      Component Value Date/Time   CALCIUM 10.1 02/18/2014 1605   CALCIUM 9.8 12/03/2013 0939   ALKPHOS 96 02/18/2014 1605   ALKPHOS 68 12/15/2008 0743   AST 24 02/18/2014 1605   AST 30 12/15/2008 0743   ALT 30 02/18/2014 1605   ALT 19 12/15/2008 0743   BILITOT 0.30 02/18/2014 1605   BILITOT 0.8 12/15/2008 0743       No results found for this basename: LABCA2    No components found with this basename: LABCA125    No results found for this basename: INR,  in the last 168  hours  Urinalysis    Component Value Date/Time   COLORURINE YELLOW 12/15/2008 0701   APPEARANCEUR HAZY* 12/15/2008 0701   LABSPEC 1.031* 12/15/2008 0701   PHURINE 5.5 12/15/2008 0701   GLUCOSEU NEGATIVE 12/15/2008 0701   HGBUR TRACE* 12/15/2008 0701   BILIRUBINUR NEGATIVE 12/15/2008 0701   KETONESUR NEGATIVE 12/15/2008 0701   PROTEINUR NEGATIVE 12/15/2008 0701   UROBILINOGEN 0.2 12/15/2008 0701   NITRITE NEGATIVE 12/15/2008 0701   LEUKOCYTESUR NEGATIVE 12/15/2008 0701    STUDIES: No results found.  ASSESSMENT: 63 y.o. [place] woman in is left breast biopsy 11/13/2013 02 separate masses, one of which was benign, the other being in a call T2 N0, stage IIA invasive ductal carcinoma, grade 1, estrogen and progesterone receptor positive, HER-2 negative, with an MIB-1 of 89%  (1) status post left lumpectomy and sentinel lymph node sampling  12/05/2013 for a pT2 pN0, stage IIA invasive ductal carcinoma, grade 2, together with ductal carcinoma in situ, which was also estrogen and progesterone receptor positive margins were involved  (2) status post additional surgery 01/01/2014 for margin clearance, showing no residual tumor  (3) Oncotype score of 15 predicted in a 10 year outside the breast risk of recurrence of 10% if the patient's only systemic treatment was tamoxifen for 5 years. It also predicted no benefit from chemotherapy  (4) adjuvant radiation to be completed 03/24/2014  (5) antiestrogen therapy with tamoxifen to start after radiation is completed  (6) the patient was tested for a BRCA2 mutation documented in other family members, c.476-2A>G (IVS5-2A>G). She did not carry this mutation   PLAN: We spent the better part of today's hour-long appointment discussing the biology of breast cancer in general, and the specifics of the patient's tumor in particular. Kaena understands her breast cancer was aggressive and fast-growing and yet it fell in the "low-risk" category and on the Oncotype test. This is why she did not receive chemotherapy: patients whose tumors fall in the low risk category get very little benefit from chemotherapy. This is in accordance with NCCN guidelines  She has had surgery and will complete her radiation treatments. Both of those are local forms of therapy. They do not affect outside the breast recurrence. To lower the risk of systemic recurrence she needs systemic treatment. Since chemotherapy would not be useful, and since her tumor was also HER-2 negative and therefore anti-HER-2 immunotherapy would not be helpful, her only option for systemic therapy is antiestrogen pills.  We then discussed in detail the difference between tamoxifen and aromatase inhibitors. She has a good understanding of the possible toxicities, side effects and complications of these agents. She also understands that to get the  full benefit she would either have to take tamoxifen for 10 years or add 2 years of anastrozole or another aromatase inhibitor to at least 3 years of tamoxifen.  She is leaning strongly towards tamoxifen and I went ahead and wrote her the prescription, although she actually will not start until 04/11/2014. By then she should be fairly recovered from the radiation treatments. She will see me again in November, by which time we should know how she is tolerating this medication. If she tolerates it well, we will start routine followup, which would be every 3 months for at least one year alternating with her surgeon.  The patient has a good understanding of the overall plan. She agrees with it. She knows the goal of treatment in her case is cure. She will call with any problems that may develop  before her next visit here.  Chauncey Cruel, MD   02/18/2014 6:01 PM

## 2014-02-18 NOTE — Progress Notes (Addendum)
Weekly assessment of radiation to left breast. Completed 9 of 25 treatments. Moderate redness.Continue application of radiaplex 2 to 3 times daily.Gave another tube of radiaplex.Has some increased tenderness especially at site of incision.

## 2014-02-18 NOTE — Progress Notes (Signed)
Weekly Management Note Current Dose: 16.2  Gy  Projected Dose: 61 Gy   Narrative:  The patient presents for routine under treatment assessment.  CBCT/MVCT images/Port film x-rays were reviewed.  The chart was checked. Doing well. Hotflashes better on zoloft. No breast complaints.   Physical Findings: Weight: 204 lb 3.2 oz (92.625 kg). Minimal pink skin.   Impression:  The patient is tolerating radiation.  Plan:  Continue treatment as planned. Continue radiaplex.

## 2014-02-19 ENCOUNTER — Ambulatory Visit
Admission: RE | Admit: 2014-02-19 | Discharge: 2014-02-19 | Disposition: A | Discharge status: Home / Self Care | Payer: BC Managed Care – PPO | Source: Ambulatory Visit | Attending: Radiation Oncology | Admitting: Radiation Oncology

## 2014-02-19 ENCOUNTER — Telehealth: Payer: Self-pay | Admitting: Oncology

## 2014-02-19 DIAGNOSIS — Z510 Encounter for antineoplastic radiation therapy: Secondary | ICD-10-CM | POA: Diagnosis not present

## 2014-02-19 NOTE — Telephone Encounter (Signed)
s.w. pt and advised on Dec appt...pt ok adn aware

## 2014-02-20 ENCOUNTER — Ambulatory Visit
Admission: RE | Admit: 2014-02-20 | Discharge: 2014-02-20 | Disposition: A | Discharge status: Home / Self Care | Payer: BC Managed Care – PPO | Source: Ambulatory Visit | Attending: Radiation Oncology | Admitting: Radiation Oncology

## 2014-02-20 DIAGNOSIS — Z510 Encounter for antineoplastic radiation therapy: Secondary | ICD-10-CM | POA: Diagnosis not present

## 2014-02-21 ENCOUNTER — Ambulatory Visit
Admission: RE | Admit: 2014-02-21 | Discharge: 2014-02-21 | Disposition: A | Discharge status: Home / Self Care | Payer: BC Managed Care – PPO | Source: Ambulatory Visit | Attending: Radiation Oncology | Admitting: Radiation Oncology

## 2014-02-21 DIAGNOSIS — Z510 Encounter for antineoplastic radiation therapy: Secondary | ICD-10-CM | POA: Diagnosis not present

## 2014-02-24 ENCOUNTER — Ambulatory Visit
Admission: RE | Admit: 2014-02-24 | Discharge: 2014-02-24 | Disposition: A | Discharge status: Home / Self Care | Payer: BC Managed Care – PPO | Source: Ambulatory Visit | Attending: Radiation Oncology | Admitting: Radiation Oncology

## 2014-02-24 DIAGNOSIS — Z510 Encounter for antineoplastic radiation therapy: Secondary | ICD-10-CM | POA: Diagnosis not present

## 2014-02-25 ENCOUNTER — Telehealth: Payer: Self-pay

## 2014-02-25 ENCOUNTER — Ambulatory Visit
Admission: RE | Admit: 2014-02-25 | Discharge: 2014-02-25 | Disposition: A | Discharge status: Home / Self Care | Payer: BC Managed Care – PPO | Source: Ambulatory Visit | Attending: Radiation Oncology | Admitting: Radiation Oncology

## 2014-02-25 VITALS — BP 113/74 | HR 75 | Temp 98.2°F | Wt 205.4 lb

## 2014-02-25 DIAGNOSIS — Z510 Encounter for antineoplastic radiation therapy: Secondary | ICD-10-CM | POA: Diagnosis not present

## 2014-02-25 DIAGNOSIS — C50312 Malignant neoplasm of lower-inner quadrant of left female breast: Secondary | ICD-10-CM

## 2014-02-25 NOTE — Telephone Encounter (Signed)
Faxed dispensing order to [facility] for L8030 and L8000.  Sent to scan.

## 2014-02-25 NOTE — Progress Notes (Signed)
Weekly assessment of radiation to left [email or web address] 14 of 25 treatments.has some discomfort of left axilla and swelling of breast.

## 2014-02-25 NOTE — Telephone Encounter (Signed)
Faxed dspensing order to [facility] for L8000 and L8020.  Sent to scan

## 2014-02-25 NOTE — Progress Notes (Signed)
  Radiation Oncology         [phone] ________________________________  Name: Connie [name] MRN: [id number]  Date: 02/25/2014  DOB: 09/29/1950  Weekly Radiation Therapy Management  Cancer of lower-inner quadrant of left female breast   Primary site: Breast   Staging method: AJCC 7th Edition   Clinical: (T2, N0, cM0)   Pathologic: Stage IIA (T2, N0, cM0) signed by [name], MD on 01/16/2014 10:36 AM   Summary: Stage IIA (T2, N0, cM0)   Current Dose: 25.2 Gy     Planned Dose:  61 Gy  Narrative . . . . . . . . The patient presents for routine under treatment assessment.                                   The patient is without complaint. She has noticed some swelling in the breast and some discomfort but does not request any pain medication at this time.                                 Set-up films were reviewed.                                 The chart was checked. Physical Findings. . .  weight is 205 lb 6.4 oz (93.169 kg). Her temperature is 98.2 F (36.8 C). Her blood pressure is 113/74 and her pulse is 75. . The left breast area shows some erythema and hyperpigmentation changes. There is some swelling noted, no signs of infection. Impression . . . . . . . The patient is tolerating radiation. Plan . . . . . . . . . . . . Continue treatment as planned.  ________________________________   Blair Promise, PhD, MD

## 2014-02-26 ENCOUNTER — Ambulatory Visit
Admission: RE | Admit: 2014-02-26 | Discharge: 2014-02-26 | Disposition: A | Discharge status: Home / Self Care | Payer: BC Managed Care – PPO | Source: Ambulatory Visit | Attending: Radiation Oncology | Admitting: Radiation Oncology

## 2014-02-26 DIAGNOSIS — Z510 Encounter for antineoplastic radiation therapy: Secondary | ICD-10-CM | POA: Diagnosis not present

## 2014-02-27 ENCOUNTER — Ambulatory Visit
Admission: RE | Admit: 2014-02-27 | Discharge: 2014-02-27 | Disposition: A | Discharge status: Home / Self Care | Payer: BC Managed Care – PPO | Source: Ambulatory Visit | Attending: Radiation Oncology | Admitting: Radiation Oncology

## 2014-02-27 DIAGNOSIS — Z510 Encounter for antineoplastic radiation therapy: Secondary | ICD-10-CM | POA: Diagnosis not present

## 2014-02-28 ENCOUNTER — Ambulatory Visit
Admission: RE | Admit: 2014-02-28 | Discharge: 2014-02-28 | Disposition: A | Discharge status: Home / Self Care | Payer: BC Managed Care – PPO | Source: Ambulatory Visit | Attending: Radiation Oncology | Admitting: Radiation Oncology

## 2014-02-28 DIAGNOSIS — Z510 Encounter for antineoplastic radiation therapy: Secondary | ICD-10-CM | POA: Diagnosis not present

## 2014-03-03 ENCOUNTER — Ambulatory Visit
Admission: RE | Admit: 2014-03-03 | Discharge: 2014-03-03 | Disposition: A | Discharge status: Home / Self Care | Payer: BC Managed Care – PPO | Source: Ambulatory Visit | Attending: Radiation Oncology | Admitting: Radiation Oncology

## 2014-03-03 DIAGNOSIS — Z510 Encounter for antineoplastic radiation therapy: Secondary | ICD-10-CM | POA: Diagnosis not present

## 2014-03-04 ENCOUNTER — Encounter: Payer: Self-pay | Admitting: Radiation Oncology

## 2014-03-04 ENCOUNTER — Ambulatory Visit
Admission: RE | Admit: 2014-03-04 | Discharge: 2014-03-04 | Disposition: A | Discharge status: Home / Self Care | Payer: BC Managed Care – PPO | Source: Ambulatory Visit | Attending: Radiation Oncology | Admitting: Radiation Oncology

## 2014-03-04 VITALS — BP 106/68 | HR 70 | Temp 97.8°F | Ht 64.0 in | Wt 206.2 lb

## 2014-03-04 DIAGNOSIS — C50312 Malignant neoplasm of lower-inner quadrant of left female breast: Secondary | ICD-10-CM

## 2014-03-04 DIAGNOSIS — Z510 Encounter for antineoplastic radiation therapy: Secondary | ICD-10-CM | POA: Diagnosis not present

## 2014-03-04 MED ORDER — RADIAPLEXRX EX GEL
Freq: Once | CUTANEOUS | Status: AC
  Administered 2014-03-04: 16:00:00 via TOPICAL

## 2014-03-04 NOTE — Progress Notes (Signed)
Ms. Laverdure has received 19 fractions to her left breast.  Note redness with beginning of rash in the upper inner portion of the left breast.  She used hydrocortisone cream in the area between her breast due to itching and note a pin sized open area in this region.  Hyperpigmentation of inframmary fold note.  C/o level 3/10 tenderness of the breast and the lower axillary region.

## 2014-03-04 NOTE — Progress Notes (Signed)
Weekly Management Note Current Dose:  34.2 Gy  Projected Dose: 45 Gy   Narrative:  The patient presents for routine under treatment assessment.  CBCT/MVCT images/Port film x-rays were reviewed.  The chart was checked. Doing well. Some breakdown medially as sweat accumulates at night then she scratches unknowingly. She is using radiaplex. She has an appointment with orthopedic surgery to look at her left knee.   Physical Findings: Weight: 206 lb 3.2 oz (93.532 kg). Dermatitis medially.   Impression:  The patient is tolerating radiation.  Plan:  Continue treatment as planned. Continue radiaplex.

## 2014-03-05 ENCOUNTER — Ambulatory Visit
Admission: RE | Admit: 2014-03-05 | Discharge: 2014-03-05 | Disposition: A | Discharge status: Home / Self Care | Payer: BC Managed Care – PPO | Source: Ambulatory Visit | Attending: Radiation Oncology | Admitting: Radiation Oncology

## 2014-03-05 DIAGNOSIS — Z510 Encounter for antineoplastic radiation therapy: Secondary | ICD-10-CM | POA: Diagnosis not present

## 2014-03-06 ENCOUNTER — Ambulatory Visit
Admission: RE | Admit: 2014-03-06 | Discharge: 2014-03-06 | Disposition: A | Discharge status: Home / Self Care | Payer: BC Managed Care – PPO | Source: Ambulatory Visit | Attending: Radiation Oncology | Admitting: Radiation Oncology

## 2014-03-06 DIAGNOSIS — Z510 Encounter for antineoplastic radiation therapy: Secondary | ICD-10-CM | POA: Diagnosis not present

## 2014-03-07 ENCOUNTER — Ambulatory Visit
Admission: RE | Admit: 2014-03-07 | Discharge: 2014-03-07 | Disposition: A | Discharge status: Home / Self Care | Payer: BC Managed Care – PPO | Source: Ambulatory Visit | Attending: Radiation Oncology | Admitting: Radiation Oncology

## 2014-03-07 DIAGNOSIS — Z510 Encounter for antineoplastic radiation therapy: Secondary | ICD-10-CM | POA: Diagnosis not present

## 2014-03-10 ENCOUNTER — Ambulatory Visit
Admission: RE | Admit: 2014-03-10 | Discharge: 2014-03-10 | Disposition: A | Discharge status: Home / Self Care | Payer: BC Managed Care – PPO | Source: Ambulatory Visit | Attending: Radiation Oncology | Admitting: Radiation Oncology

## 2014-03-10 DIAGNOSIS — Z510 Encounter for antineoplastic radiation therapy: Secondary | ICD-10-CM | POA: Diagnosis not present

## 2014-03-11 ENCOUNTER — Encounter: Payer: Self-pay | Admitting: Radiation Oncology

## 2014-03-11 ENCOUNTER — Ambulatory Visit
Admission: RE | Admit: 2014-03-11 | Discharge: 2014-03-11 | Disposition: A | Discharge status: Home / Self Care | Payer: BC Managed Care – PPO | Source: Ambulatory Visit | Attending: Radiation Oncology | Admitting: Radiation Oncology

## 2014-03-11 DIAGNOSIS — C50312 Malignant neoplasm of lower-inner quadrant of left female breast: Secondary | ICD-10-CM

## 2014-03-11 DIAGNOSIS — Z510 Encounter for antineoplastic radiation therapy: Secondary | ICD-10-CM | POA: Diagnosis not present

## 2014-03-11 MED ORDER — RADIAPLEXRX EX GEL
Freq: Once | CUTANEOUS | Status: AC
Start: 2014-03-11 — End: 2014-03-11
  Administered 2014-03-11: 16:00:00 via TOPICAL

## 2014-03-11 NOTE — Progress Notes (Signed)
Weekly Management Note Current Dose: 43.2 Gy  Projected Dose: 61 Gy   Narrative:  The patient presents for routine under treatment assessment.  CBCT/MVCT images/Port film x-rays were reviewed.  The chart was checked. Saw on tx table to set up electron boost. Sore in inframammary fold and in axilla. Using hydrocortisone and radiaplex.   Physical Findings: Dermatitis medially and in inframammary fold. No moist desquamation.   Impression:  The patient is tolerating radiation.  Plan:  Continue treatment as planned. Add neosporin plus pain if moist desquamation develops. Continue other topical treatments.

## 2014-03-12 ENCOUNTER — Ambulatory Visit
Admission: RE | Admit: 2014-03-12 | Discharge: 2014-03-12 | Disposition: A | Discharge status: Home / Self Care | Payer: BC Managed Care – PPO | Source: Ambulatory Visit | Attending: Radiation Oncology | Admitting: Radiation Oncology

## 2014-03-12 DIAGNOSIS — Z510 Encounter for antineoplastic radiation therapy: Secondary | ICD-10-CM | POA: Diagnosis not present

## 2014-03-13 ENCOUNTER — Ambulatory Visit
Admission: RE | Admit: 2014-03-13 | Discharge: 2014-03-13 | Disposition: A | Discharge status: Home / Self Care | Payer: BC Managed Care – PPO | Source: Ambulatory Visit | Attending: Radiation Oncology | Admitting: Radiation Oncology

## 2014-03-13 ENCOUNTER — Encounter: Payer: Self-pay | Admitting: Radiation Oncology

## 2014-03-13 DIAGNOSIS — Z510 Encounter for antineoplastic radiation therapy: Secondary | ICD-10-CM | POA: Diagnosis not present

## 2014-03-13 DIAGNOSIS — C50312 Malignant neoplasm of lower-inner quadrant of left female breast: Secondary | ICD-10-CM

## 2014-03-13 MED ORDER — BIAFINE EX EMUL
CUTANEOUS | Status: DC | PRN
  Administered 2014-03-13: 16:00:00 via TOPICAL

## 2014-03-13 NOTE — Progress Notes (Signed)
  Radiation Oncology         [phone]) [phone] ________________________________  Name: Connie [name] MRN: [id number]  Date: 03/13/2014  DOB: 1951/01/05  Simulation Verification Note (boost)  Status: outpatient  NARRATIVE: The patient was brought to the treatment unit and placed in the planned treatment position. The clinical setup was verified. Then port films were obtained and uploaded to the radiation oncology medical record software.  The treatment beams were carefully compared against the planned radiation fields. The position location and shape of the radiation fields was reviewed. The targeted volume of tissue appears appropriately covered by the radiation beams. Organs at risk appear to be excluded as planned.  Based on my personal review, I approved the simulation verification. The patient's treatment will proceed as planned.  ------------------------------------------------  [name], MD

## 2014-03-13 NOTE — Progress Notes (Signed)
Given  biafine and hydrogel pads and explained how to apply for increased redness and peeling of mammary fold.

## 2014-03-14 ENCOUNTER — Ambulatory Visit
Admission: RE | Admit: 2014-03-14 | Discharge: 2014-03-14 | Disposition: A | Discharge status: Home / Self Care | Payer: BC Managed Care – PPO | Source: Ambulatory Visit | Attending: Radiation Oncology | Admitting: Radiation Oncology

## 2014-03-14 DIAGNOSIS — Z510 Encounter for antineoplastic radiation therapy: Secondary | ICD-10-CM | POA: Diagnosis not present

## 2014-03-17 ENCOUNTER — Ambulatory Visit
Admission: RE | Admit: 2014-03-17 | Discharge: 2014-03-17 | Disposition: A | Discharge status: Home / Self Care | Payer: BC Managed Care – PPO | Source: Ambulatory Visit | Attending: Radiation Oncology | Admitting: Radiation Oncology

## 2014-03-17 DIAGNOSIS — Z510 Encounter for antineoplastic radiation therapy: Secondary | ICD-10-CM | POA: Diagnosis not present

## 2014-03-18 ENCOUNTER — Ambulatory Visit
Admission: RE | Admit: 2014-03-18 | Discharge: 2014-03-18 | Disposition: A | Discharge status: Home / Self Care | Payer: BC Managed Care – PPO | Source: Ambulatory Visit | Attending: Radiation Oncology | Admitting: Radiation Oncology

## 2014-03-18 VITALS — BP 126/77 | HR 72 | Temp 97.9°F | Wt 204.2 lb

## 2014-03-18 DIAGNOSIS — Z510 Encounter for antineoplastic radiation therapy: Secondary | ICD-10-CM | POA: Diagnosis not present

## 2014-03-18 DIAGNOSIS — C50312 Malignant neoplasm of lower-inner quadrant of left female breast: Secondary | ICD-10-CM

## 2014-03-18 NOTE — Progress Notes (Signed)
Weekly Management Note Current Dose: 53  Gy  Projected Dose: 61 Gy   Narrative:  The patient presents for routine under treatment assessment.  CBCT/MVCT images/Port film x-rays were reviewed.  The chart was checked.  Doing well. Irritation and pain under arm and under breast. Using neosporin, biafene and hydrogel pads. Excited to be finished. Fatigue continues. Napping a lot   Physical Findings: Weight: 204 lb 3.2 oz (92.625 kg). Dry desquamation over entire breast. Moist desquamation in the inframammary folds.   Impression:  The patient is tolerating radiation.  Plan:  Continue treatment as planned. Continue supportive skin care as above. OK to switch to lotion with vitamin e after skin heals. Has script for AI.

## 2014-03-18 NOTE — Progress Notes (Signed)
Weekly assessment of radiation to left breast.Skin is red with some improvement of peeling of left mammary fold.continue neosporine of fold and biafine everywhere else.Hydrogels have helped.Generalized fatigue but able to perform daily activities.

## 2014-03-19 ENCOUNTER — Ambulatory Visit
Admission: RE | Admit: 2014-03-19 | Discharge: 2014-03-19 | Disposition: A | Discharge status: Home / Self Care | Payer: BC Managed Care – PPO | Source: Ambulatory Visit | Attending: Radiation Oncology | Admitting: Radiation Oncology

## 2014-03-19 DIAGNOSIS — Z510 Encounter for antineoplastic radiation therapy: Secondary | ICD-10-CM | POA: Diagnosis not present

## 2014-03-20 ENCOUNTER — Ambulatory Visit
Admission: RE | Admit: 2014-03-20 | Discharge: 2014-03-20 | Disposition: A | Discharge status: Home / Self Care | Payer: BC Managed Care – PPO | Source: Ambulatory Visit | Attending: Radiation Oncology | Admitting: Radiation Oncology

## 2014-03-20 DIAGNOSIS — Z510 Encounter for antineoplastic radiation therapy: Secondary | ICD-10-CM | POA: Diagnosis not present

## 2014-03-21 ENCOUNTER — Ambulatory Visit
Admission: RE | Admit: 2014-03-21 | Discharge: 2014-03-21 | Disposition: A | Discharge status: Home / Self Care | Payer: BC Managed Care – PPO | Source: Ambulatory Visit | Attending: Radiation Oncology | Admitting: Radiation Oncology

## 2014-03-21 DIAGNOSIS — Z510 Encounter for antineoplastic radiation therapy: Secondary | ICD-10-CM | POA: Diagnosis not present

## 2014-03-22 ENCOUNTER — Encounter: Payer: Self-pay | Admitting: Internal Medicine

## 2014-03-24 ENCOUNTER — Ambulatory Visit
Admission: RE | Admit: 2014-03-24 | Discharge: 2014-03-24 | Disposition: A | Discharge status: Home / Self Care | Payer: BC Managed Care – PPO | Source: Ambulatory Visit | Attending: Radiation Oncology | Admitting: Radiation Oncology

## 2014-03-24 ENCOUNTER — Encounter: Payer: Self-pay | Admitting: Radiation Oncology

## 2014-03-24 DIAGNOSIS — Z510 Encounter for antineoplastic radiation therapy: Secondary | ICD-10-CM | POA: Diagnosis not present

## 2014-03-25 NOTE — Progress Notes (Signed)
  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  Left breast boost/ 16 Gy at 2 Gy per fraction x 8 fractions  Beams/energy:  Opposed tangents with electronic compensation and breath hold technique for cardiac sparing / 6 MV photons Enface electrons with 15MeV for 3 fractions followed by tangent fields with 6 and 15 MV photons for 5 fractions  Narrative: The patient tolerated radiation treatment relatively well.   She had moist desquamation in the inframammary folds treated with neosporin and hydrogel pads. Dry desquamation over the rest of the breast was treated with biafene.    Plan: The patient has completed radiation treatment. The patient will return to radiation oncology clinic for routine followup in one month. I advised them to call or return sooner if they have any questions or concerns related to their recovery or treatment.She also has scheduled follow up with medical oncology.   ------------------------------------------------  [name], MD

## 2014-04-08 ENCOUNTER — Encounter: Payer: Self-pay | Admitting: Radiation Oncology

## 2014-04-11 ENCOUNTER — Encounter (INDEPENDENT_AMBULATORY_CARE_PROVIDER_SITE_OTHER): Payer: Self-pay | Admitting: General Surgery

## 2014-04-11 ENCOUNTER — Encounter (INDEPENDENT_AMBULATORY_CARE_PROVIDER_SITE_OTHER): Payer: Self-pay

## 2014-04-11 NOTE — Progress Notes (Signed)
Name: MEMORY HEINRICHS   MRN: [id number]  Date:  03/10/14   DOB: 1951-06-26  Status:outpatient    DIAGNOSIS: Left breast cancer  CONSENT VERIFIED: yes   SET UP: Patient is setup supine   IMMOBILIZATION:  The following immobilization was used:Custom Moldable Pillow, breast board.   NARRATIVE: [name] underwent complex simulation and treatment planning for her boost treatment today.  Her tumor volume was outlined on the planning CT scan.  Due to the depth of her cavity, electrons could not be used solely so a combination photon and electron plan was developed. The photon plan consisted of mini-tangents with 15 and 6 MV photons prescribed to the 95% IDL for 5 fractions to 10 Gy.  The electron plan was 15 MeV electrons for 3 fractions prescribed to the 100% IDL.  I personally supervised and approved the creation of 2 unique MLCs comprising 2   treatment devices.

## 2014-04-14 ENCOUNTER — Telehealth: Payer: Self-pay

## 2014-04-14 NOTE — Telephone Encounter (Signed)
[facility] Form for the [facility] was completed and mailed out to patient on 04/11/14.Copy forwarded to medical records to be scanned in.

## 2014-05-01 ENCOUNTER — Ambulatory Visit
Admission: RE | Admit: 2014-05-01 | Discharge: 2014-05-01 | Disposition: A | Discharge status: Home / Self Care | Payer: BC Managed Care – PPO | Source: Ambulatory Visit | Attending: Radiation Oncology | Admitting: Radiation Oncology

## 2014-05-01 VITALS — BP 126/82 | HR 72 | Temp 98.2°F | Resp 12 | Wt 205.5 lb

## 2014-05-01 DIAGNOSIS — C50312 Malignant neoplasm of lower-inner quadrant of left female breast: Secondary | ICD-10-CM

## 2014-05-01 NOTE — Progress Notes (Signed)
She is currently in no pain. Pt complains of itching, Loss of Sleep, Fatigue and Poor Appetite.  Pt left breast positive for breast tenderness and pruritus, hyperpigmentation, "shooting" pain, no edema noted.   The patient eats a regular, healthy diet.Marland Kitchen

## 2014-05-01 NOTE — Progress Notes (Signed)
   Department of Radiation Oncology  Phone:  [phone] Fax:        [phone]   Name: Connie [name] MRN: [id number]  DOB: 1951/03/23  Date: 05/01/2014  Follow Up Visit Note  Diagnosis:    ICD-9-CM ICD-10-CM  1. Cancer of lower-inner quadrant of left female breast 174.3 C50.312    Summary and Interval since last radiation: 61 gy completed 03/24/14  Interval History: Romonia presents today for routine followup.  She is doing well. Her brother in law died in [place] so she was up there for that. She and her husband have joined the [employer] and she is attending [employer].  She has breast pain and swelling which does not allow her to wear a bra. She also has zaps of pain occasionally. She is pleased with her cosmetic result. She is seeing Dr. Barry Dienes next week. Her hot flashes are better.   Physical Exam:  Filed Vitals:   05/01/14 1513  BP: 126/82  Pulse: 72  Temp: 98.2 F (36.8 C)  TempSrc: Oral  Resp: 12  Weight: 205 lb 8 oz (93.214 kg)  SpO2: 97%   Edema over the lower part of her left breast. Painful to palpation  IMPRESSION: Mairin is a 63 y.o. female s/p breast conservation with resolving acute effects of treatment.   PLAN:  She is doing well. We discussed the need for follow up every 4-6 months which she has scheduled.  We discussed the need for yearly mammograms which she can schedule with her OBGYN or with medical oncology. We discussed the need for sun protection in the treated area.  She can always call me with questions.  I will follow up with her on an as needed basis.   I have offered to refer her to PT but she thought maybe just tyring on her own and going to the ABC class would be helpful.    [name], MD

## 2014-05-14 ENCOUNTER — Ambulatory Visit: Payer: BC Managed Care – PPO | Attending: General Surgery | Admitting: Physical Therapy

## 2014-05-14 DIAGNOSIS — Z923 Personal history of irradiation: Secondary | ICD-10-CM | POA: Insufficient documentation

## 2014-05-14 DIAGNOSIS — Z5189 Encounter for other specified aftercare: Secondary | ICD-10-CM | POA: Diagnosis present

## 2014-05-14 DIAGNOSIS — J42 Unspecified chronic bronchitis: Secondary | ICD-10-CM | POA: Insufficient documentation

## 2014-05-14 DIAGNOSIS — C50312 Malignant neoplasm of lower-inner quadrant of left female breast: Secondary | ICD-10-CM | POA: Insufficient documentation

## 2014-05-14 DIAGNOSIS — I10 Essential (primary) hypertension: Secondary | ICD-10-CM | POA: Diagnosis not present

## 2014-05-14 DIAGNOSIS — Z79899 Other long term (current) drug therapy: Secondary | ICD-10-CM | POA: Insufficient documentation

## 2014-05-14 DIAGNOSIS — E785 Hyperlipidemia, unspecified: Secondary | ICD-10-CM | POA: Insufficient documentation

## 2014-05-14 DIAGNOSIS — I890 Lymphedema, not elsewhere classified: Secondary | ICD-10-CM | POA: Diagnosis not present

## 2014-05-20 ENCOUNTER — Ambulatory Visit: Payer: BC Managed Care – PPO

## 2014-05-20 DIAGNOSIS — Z5189 Encounter for other specified aftercare: Secondary | ICD-10-CM | POA: Diagnosis not present

## 2014-05-26 ENCOUNTER — Ambulatory Visit: Payer: BC Managed Care – PPO

## 2014-05-26 DIAGNOSIS — Z5189 Encounter for other specified aftercare: Secondary | ICD-10-CM | POA: Diagnosis not present

## 2014-07-01 ENCOUNTER — Ambulatory Visit (HOSPITAL_BASED_OUTPATIENT_CLINIC_OR_DEPARTMENT_OTHER): Payer: BC Managed Care – PPO | Admitting: Oncology

## 2014-07-01 ENCOUNTER — Other Ambulatory Visit (HOSPITAL_BASED_OUTPATIENT_CLINIC_OR_DEPARTMENT_OTHER): Payer: BC Managed Care – PPO

## 2014-07-01 ENCOUNTER — Telehealth: Payer: Self-pay | Admitting: Oncology

## 2014-07-01 VITALS — BP 133/79 | HR 79 | Temp 98.1°F | Resp 18 | Ht 64.0 in | Wt 202.9 lb

## 2014-07-01 DIAGNOSIS — C50312 Malignant neoplasm of lower-inner quadrant of left female breast: Secondary | ICD-10-CM

## 2014-07-01 DIAGNOSIS — Z170 Estrogen receptor positive status [ER+]: Secondary | ICD-10-CM

## 2014-07-01 DIAGNOSIS — N951 Menopausal and female climacteric states: Secondary | ICD-10-CM

## 2014-07-01 LAB — COMPREHENSIVE METABOLIC PANEL (CC13)
ALBUMIN: 3.5 g/dL (ref 3.5–5.0)
ALK PHOS: 80 U/L (ref 40–150)
ALT: 21 U/L (ref 0–55)
AST: 21 U/L (ref 5–34)
Anion Gap: 10 mEq/L (ref 3–11)
BUN: 17.4 mg/dL (ref 7.0–26.0)
CO2: 27 mEq/L (ref 22–29)
Calcium: 9.5 mg/dL (ref 8.4–10.4)
Chloride: 104 mEq/L (ref 98–109)
Creatinine: 1 mg/dL (ref 0.6–1.1)
Glucose: 116 mg/dl (ref 70–140)
POTASSIUM: 4 meq/L (ref 3.5–5.1)
SODIUM: 141 meq/L (ref 136–145)
TOTAL PROTEIN: 6.7 g/dL (ref 6.4–8.3)
Total Bilirubin: 0.31 mg/dL (ref 0.20–1.20)

## 2014-07-01 LAB — CBC WITH DIFFERENTIAL/PLATELET
BASO%: 0.1 % (ref 0.0–2.0)
Basophils Absolute: 0 10*3/uL (ref 0.0–0.1)
EOS ABS: 0.1 10*3/uL (ref 0.0–0.5)
EOS%: 1.8 % (ref 0.0–7.0)
HCT: 40.9 % (ref 34.8–46.6)
HGB: 13.9 g/dL (ref 11.6–15.9)
LYMPH%: 23.5 % (ref 14.0–49.7)
MCH: 31.3 pg (ref 25.1–34.0)
MCHC: 34 g/dL (ref 31.5–36.0)
MCV: 92.1 fL (ref 79.5–101.0)
MONO#: 0.4 10*3/uL (ref 0.1–0.9)
MONO%: 6 % (ref 0.0–14.0)
NEUT%: 68.6 % (ref 38.4–76.8)
NEUTROS ABS: 4.7 10*3/uL (ref 1.5–6.5)
Platelets: 212 10*3/uL (ref 145–400)
RBC: 4.44 10*6/uL (ref 3.70–5.45)
RDW: 13.1 % (ref 11.2–14.5)
WBC: 6.8 10*3/uL (ref 3.9–10.3)
lymph#: 1.6 10*3/uL (ref 0.9–3.3)

## 2014-07-01 MED ORDER — CLONIDINE HCL 0.1 MG/24HR TD PTWK: 0.1000 mg | MEDICATED_PATCH | TRANSDERMAL | Status: DC

## 2014-07-01 NOTE — Progress Notes (Signed)
[place]  Telephone:(336) [phone] Fax:(336) [phone]     ID: Connie [name] DOB: [birth year]  MR#: [id number]  YCX#:[id number]  PCP: Marton Redwood, MD GYN: [name] SU: [name] OTHER MD: [name]. [name]  CHIEF COMPLAINT: Estrogen receptor positive breast cancer  CURRENT TREATMENT: Adjuvant tamoxifen   BREAST CANCER HISTORY: From the original intake note:  The patient herself palpated a mass in her left breast around Christmastime 2014 but was about to undergo knee surgery at the time and did not immediately followup on this. On 11/06/2013 she brought it to Dr. [name] attention and he scheduled her for bilateral diagnostic mammography and left ultrasonography at the breast Center. This showed a 2.4 cm mass in the left lower inner quadrant, with lobulated margins. This was palpable and appeared fixed. Ultrasound confirmed an irregular hypoechoic solid mass at the 8:30 position 5 cm from the nipple measuring 2.1 cm. Medial to this was an oval hypoechoic mass measuring 0.7 cm and inferior to this there were at least 2 additional hypoechoic nodules, the largest measuring 0.8 cm. Ultrasound of the left axilla was negative.  Ultrasound-guided biopsy of the 2 more prominent left breast masses 11/13/2013 showed ([id number]) one of the masses to be benign. The other one was an invasive ductal carcinoma, grade 1 estrogen receptor 100% positive, progesterone receptor 91% positive, both with strong staining intensity, with an MIB-1 of 89%, and no HER-2 amplification, the signals ratio being 0.93 and the number per cell 1.95.  On 11/21/2013 the patient underwent bilateral breast MRI. This showed, in the 8:00 position of the left breast, a 2.6 cm enhancing mass with irregular borders. Lateral to this there was a second biopsy marker clip surrounded by minimal enhancement. There was no mass in that area. Ultrasound had shown evidence of possible satellite masses  inferior to the dominant mass but these were not seen by MRI. There were no other findings, the right breast was normal, and the left axilla showed no abnormal notes.  On 12/05/2013 the patient underwent left lumpectomy and sentinel lymph node sampling. This showed a 2.9 cm invasive ductal carcinoma, grade 3, less than a millimeter from the closest margin. 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 Estrogen receptor and progesterone receptor studies were obtained on the DCIS and are positive at 100% for the estrogen receptor and 39%, progesterone receptor, both with strong staining intensity.  On 01/01/2014 the patient underwent additional surgery for margin clearance. This showed no residual tumor.  In addition, an Oncotype scan was obtained from the invasive tumor (lumpectomy specimen) and showed a score of 15, predicting a ten-year risk of outside the breast recurrence of 10% if the patient's only systemic treatment was tamoxifen for 5 years. It also predicted no benefit from chemotherapy.  The patient was evaluated by Dr. Pablo Ledger and started radiation treatments 02/11/2014. She also met with genetics, since there was a documented BRCA 2 mutation in a first cousin. The patient was tested specifically for this mutation, and was found to be negative.  Her subsequent history is as detailed below  INTERVAL HISTORY: Connie [name] returns today for follow-up of her breast cancer. Since her last visit here she completed her radiation treatments. She has some fluid in the left breast which she learned through physical therapy to massage. She is using compression breasts. That is helping but unfortunately this is going to be a chronic problem. She started tamoxifen on 04/11/2014. She has "intolerable"  hot flashes. These occur mostly during the day. She sleeps through the night without difficulty.  REVIEW OF SYSTEMS: She is particularly stressed because her 62 year old mother  is spending some weeks with her. She is benefiting from C Pap. She was started on desvenlafaxine by Dr. Blenda Bridegroom but so far that has not helped her hot flashes. She has some sinus symptoms, a little bit of a dry cough, and some joint pain here in there which is not more persistent or intense than prior. Her migraines her back. She tells me she is controlling them well however. A detailed review of systems today was otherwise stable  PAST MEDICAL HISTORY: Past Medical History  Diagnosis Date  . IBS (irritable bowel syndrome)   . GERD (gastroesophageal reflux disease)   . High cholesterol   . Depression   . PLMD (periodic limb movement disorder) 04/30/2013  . PVC's (premature ventricular contractions) 04/30/2013  . Bronchitis, chronic   . Headache(784.0)     MIGRAINES  . Meniscus tear     LEFT x 2  . Arthritis     L FOOT  . OSA (obstructive sleep apnea) 04/30/2013    USES C -PAP  . Pneumonia   . Anxiety   . Diverticulitis   . History of shingles Feb. 2015  . Wears glasses   . Cancer     left breast cancer  . Breast cancer 11/13/13    left breast invasive ductal ca,  . Radiation 02/06/14-03/24/14    Left breast ductal cancer    PAST SURGICAL HISTORY: Past Surgical History  Procedure Laterality Date  . Knee surgery Left 2000-2001     RT KNEE X1  . Appendectomy  1984  . Osteotomy      RT KNEE  . Knee arthroscopy Left 08/28/2013    Procedure: LEFT KNEE ARTHROSCOPY WITH MEDIAL AND LATERAL DEBRIDEMENT;  Surgeon: Gearlean Alf, MD;  Location: [facility];  Service: Orthopedics;  Laterality: Left;  . Dilation and curettage of uterus    . Breast lumpectomy Left 12/05/2013    Procedure: BREAST LUMPECTOMY WITH EXCISION OF SENTINEL NODE;  Surgeon: [name], MD;  Location: [facility];  Service: General;  Laterality: Left;  . Re-excision of breast lumpectomy Left 01/01/2014    Procedure: RE-EXCISION OF left BREAST cancer ;  Surgeon: [name], MD;  Location: [facility];  Service:  General;  Laterality: Left;    FAMILY HISTORY Family History  Problem Relation Age of Onset  . Cancer Father 23    esophageal and prostate cancer - smoker  . Cancer Paternal Aunt     breast at unknown age  . Cancer Cousin 31    Breast cancer in a paternal first cousin; BRCA2 + , IVS5-2A>G. Tested at [facility]  . Cancer Cousin 56    breast cancer in a paternal first cousin (sister to the cousin with the BRCA2 mutation)   the patient's father died from esophageal cancer at age 2. The patient's mother is living, at age 56. The patient had one brother, 2 sisters. One of the patient's father's 2 sisters was diagnosed with breast cancer and that aunt has 3 daughters, one of whom had breast cancer at an early age. The daughter was tested and was found to carry a BRCA2 mutation which Zhaniya however does not carry.  GYNECOLOGIC HISTORY:  No LMP recorded. Patient is postmenopausal. Menarche age 60, first live birth age 45, which the patient understands increases the risk of breast cancer. She  stopped having periods approximately 10 years ago and has been on hormone replacement since that time, stopping in April of 2015.  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 Her husband Frederico Hamman worked as an Clinical biochemist. He also recently retired. This is his second marriage for both of them. She has 2 children from her first marriage, Gaylord Shih, who works in [employer] in [place], and [employer], who works in Pharmacologist and lives in [place]. Frederico Hamman has 2 children, Anderson Malta lives in [place] and [place] who lives in [place]. Anderson Malta is a stay-at-home mom Wendi Maya is a Education officer, museum. They have one grandson, who lives in [place]: In place   HEALTH MAINTENANCE: History  Substance Use Topics  . Smoking status: Never Smoker   . Smokeless tobacco: Never Used   . Alcohol Use: Yes     Comment: SOCIALLY     Colonoscopy: 2009, under lab our. She also had EGD at the same time given her father's history of esophageal cancer  PAP: 2014  Bone density: 2014 at [facility]; lowest T score was -0.9 (normal)  Lipid panel:  No Known Allergies  Current Outpatient Prescriptions  Medication Sig Dispense Refill  . aspirin 81 MG tablet Take 81 mg by mouth daily.    Marland Kitchen atenolol (TENORMIN) 25 MG tablet Take 25 mg by mouth every morning.     Marland Kitchen atorvastatin (LIPITOR) 40 MG tablet Take 40 mg by mouth every evening.     Marland Kitchen b complex vitamins tablet Take 1 tablet by mouth daily.    . butalbital-aspirin-caffeine (FIORINAL) 50-325-40 MG per capsule Take 1-2 capsules by mouth 2 (two) times daily as needed for headache.     . cholecalciferol (VITAMIN D) 1000 UNITS tablet Take 5,000 Units by mouth daily.     . cyclobenzaprine (FLEXERIL) 10 MG tablet Take 10 mg by mouth daily as needed for muscle spasms.    Marland Kitchen desvenlafaxine (PRISTIQ) 50 MG 24 hr tablet Take 50 mg by mouth daily.    . diclofenac sodium (VOLTAREN) 1 % GEL Apply 4 g topically 4 (four) times daily as needed. pain    . diphenhydrAMINE (BENADRYL) 25 MG tablet Take 50 mg by mouth at bedtime.    Marland Kitchen emollient (BIAFINE) cream Apply topically 2 (two) times daily. Apply to affected area twice daily but not 3 to 4 hours prior to radiation.    . fluticasone (FLONASE) 50 MCG/ACT nasal spray     . glucosamine-chondroitin 500-400 MG tablet Take 1 tablet by mouth 2 (two) times daily.    . hyaluronate sodium (RADIAPLEXRX) GEL Apply 1 application topically 2 (two) times daily. Apply to breast area after rad tx and bedtime daily & prn    . HYDROcodone-acetaminophen (NORCO) 5-325 MG per tablet Take 1-2 tablets by mouth every 4 (four) hours as needed for moderate pain. 30 tablet 0  . hyoscyamine (LEVSIN, ANASPAZ) 0.125 MG tablet     . magnesium oxide (MAG-OX) 400 MG tablet Take 400 mg by mouth daily.    . Multiple Vitamin  (MULTIVITAMIN) tablet Take 1 tablet by mouth daily.    . non-metallic deodorant Jethro Poling) MISC Apply 1 application topically daily as needed.    Marland Kitchen omega-3 acid ethyl esters (LOVAZA) 1 G capsule Take 1 g by mouth 2 (two) times daily.    Marland Kitchen omeprazole (PRILOSEC OTC) 20 MG tablet Take 20  mg by mouth daily.    . tamoxifen (NOLVADEX) 20 MG tablet Take 1 tablet (20 mg total) by mouth daily. 90 tablet 12  . UNABLE TO FIND [id number] post mastectomy camisole Qty: 2 L8000 Post-surgical bra: Qty: 6 [id number] Silicone Breast Prosthesis Qty: 1  Diagnosis Code 174.9 1 each    No current facility-administered medications for this visit.    OBJECTIVE: Middle-aged white woman in no acute distress Filed Vitals:   07/01/14 1503  BP: 133/79  Pulse: 79  Temp: 98.1 F (36.7 C)  Resp: 18     Body mass index is 34.81 kg/(m^2).    ECOG FS:1 - Symptomatic but completely ambulatory  Ocular: Sclerae unicteric, pupils round and equal Ear-nose-throat: Oropharynx clear and moist Lymphatic: No cervical or supraclavicular adenopathy Lungs no rales or rhonchi Heart regular rate and rhythm Abd soft, obese, nontender, positive bowel sounds MSK no focal spinal tenderness, no upper extremity lymphedema Neuro: non-focal, well-oriented, appropriate affect Breasts: The right breast is unremarkable. The left breast is status post lumpectomy and radiation. There is some residual erythema. There are no palpable masses. There is no obvious fluctuant mass. The left axilla is benign.   LAB RESULTS:  CMP     Component Value Date/Time   NA 141 02/18/2014 1605   NA 141 12/03/2013 0939   K 4.1 02/18/2014 1605   K 4.5 12/03/2013 0939   CL 103 12/03/2013 0939   CO2 28 02/18/2014 1605   CO2 25 12/03/2013 0939   GLUCOSE 91 02/18/2014 1605   GLUCOSE 109* 12/03/2013 0939   BUN 14.3 02/18/2014 1605   BUN 12 12/03/2013 0939   CREATININE 0.9 02/18/2014 1605   CREATININE 0.79 12/03/2013 0939   CALCIUM 10.1 02/18/2014 1605   CALCIUM  9.8 12/03/2013 0939   PROT 7.1 02/18/2014 1605   PROT 7.0 12/15/2008 0743   ALBUMIN 3.8 02/18/2014 1605   ALBUMIN 3.8 12/15/2008 0743   AST 24 02/18/2014 1605   AST 30 12/15/2008 0743   ALT 30 02/18/2014 1605   ALT 19 12/15/2008 0743   ALKPHOS 96 02/18/2014 1605   ALKPHOS 68 12/15/2008 0743   BILITOT 0.30 02/18/2014 1605   BILITOT 0.8 12/15/2008 0743   GFRNONAA 88* 12/03/2013 0939   GFRAA >90 12/03/2013 0939    I No results found for: SPEP  Lab Results  Component Value Date   WBC 6.8 07/01/2014   NEUTROABS 4.7 07/01/2014   HGB 13.9 07/01/2014   HCT 40.9 07/01/2014   MCV 92.1 07/01/2014   PLT 212 07/01/2014      Chemistry      Component Value Date/Time   NA 141 02/18/2014 1605   NA 141 12/03/2013 0939   K 4.1 02/18/2014 1605   K 4.5 12/03/2013 0939   CL 103 12/03/2013 0939   CO2 28 02/18/2014 1605   CO2 25 12/03/2013 0939   BUN 14.3 02/18/2014 1605   BUN 12 12/03/2013 0939   CREATININE 0.9 02/18/2014 1605   CREATININE 0.79 12/03/2013 0939      Component Value Date/Time   CALCIUM 10.1 02/18/2014 1605   CALCIUM 9.8 12/03/2013 0939   ALKPHOS 96 02/18/2014 1605   ALKPHOS 68 12/15/2008 0743   AST 24 02/18/2014 1605   AST 30 12/15/2008 0743   ALT 30 02/18/2014 1605   ALT 19 12/15/2008 0743   BILITOT 0.30 02/18/2014 1605   BILITOT 0.8 12/15/2008 0743       No results found for: LABCA2  No components found for: [id number]  No results for input(s): INR in the last 168 hours.  Urinalysis    Component Value Date/Time   COLORURINE YELLOW 12/15/2008 0701   APPEARANCEUR HAZY* 12/15/2008 0701   LABSPEC 1.031* 12/15/2008 0701   PHURINE 5.5 12/15/2008 0701   GLUCOSEU NEGATIVE 12/15/2008 0701   HGBUR TRACE* 12/15/2008 0701   BILIRUBINUR NEGATIVE 12/15/2008 0701   KETONESUR NEGATIVE 12/15/2008 0701   PROTEINUR NEGATIVE 12/15/2008 0701   UROBILINOGEN 0.2 12/15/2008 0701   NITRITE NEGATIVE 12/15/2008 0701   LEUKOCYTESUR NEGATIVE 12/15/2008 0701     STUDIES: No results found.  ASSESSMENT: 63 y.o. [place] woman s/p left breast biopsy 11/13/2013 for a clinical T2 N0, stage IIA invasive ductal carcinoma, grade 1, estrogen and progesterone receptor positive, HER-2 negative, with an MIB-1 of 89%  (1) status post left lumpectomy and sentinel lymph node sampling 12/05/2013 for a pT2 pN0, stage IIA invasive ductal carcinoma, grade 2, together with ductal carcinoma in situ, which was also estrogen and progesterone receptor positive margins were involved  (2) status post additional surgery 01/01/2014 for margin clearance, showing no residual tumor  (3) Oncotype score of 15 predicted in a 10 year outside the breast risk of recurrence of 10% if the patient's only systemic treatment was tamoxifen for 5 years. It also predicted no benefit from chemotherapy  (4) adjuvant radiation completed 03/24/2014  (5) tamoxifen started 04/11/2014  (6) the patient was tested for a BRCA2 mutation documented in other family members, c.476-2A>G (IVS5-2A>G). She does not carry this mutation   PLAN: Zion need some help with the hot flashes. She is already on  a desvenlafaxine. We went over her blood pressures and they are on the high normal side, so I think she could tolerate TTS 1. We discussed the possible toxicities side effects and complications of clonidine. I went ahead and wrote her for the patches. If this does not work for her we will consider low-dose Megace.  She has a good understanding of the overall plan. She agrees with it. She knows the goal of treatment in her case is cure. She will call with any problems that may develop before her next visit here, which will be shortly after her next mammography in April 2016.Marland Kitchen  Chauncey Cruel, MD   07/01/2014 3:40 PM

## 2014-07-02 ENCOUNTER — Encounter: Payer: Self-pay | Admitting: Oncology

## 2014-07-02 ENCOUNTER — Telehealth: Payer: Self-pay | Admitting: Oncology

## 2014-07-02 NOTE — Addendum Note (Signed)
Addended by: Laureen Abrahams on: 07/02/2014 06:31 PM   Modules accepted: Orders, Medications

## 2014-07-02 NOTE — Telephone Encounter (Signed)
cld & spoke to pt and gave pt time & date for mamma-pt understood

## 2014-08-08 ENCOUNTER — Encounter: Payer: Self-pay | Admitting: *Deleted

## 2014-08-08 ENCOUNTER — Encounter: Payer: Self-pay | Admitting: Oncology

## 2014-09-24 ENCOUNTER — Telehealth: Payer: Self-pay | Admitting: Oncology

## 2014-09-24 NOTE — Telephone Encounter (Signed)
pt cld to r/s appt-gave pt r/s appt time & date-pt understood-pt wanted to CX because going out of town-adv next avail was in June

## 2014-11-10 ENCOUNTER — Ambulatory Visit
Admission: RE | Admit: 2014-11-10 | Discharge: 2014-11-10 | Disposition: A | Discharge status: Home / Self Care | Payer: Self-pay | Source: Ambulatory Visit | Attending: Oncology | Admitting: Oncology

## 2014-11-10 DIAGNOSIS — C50312 Malignant neoplasm of lower-inner quadrant of left female breast: Secondary | ICD-10-CM

## 2014-11-17 ENCOUNTER — Other Ambulatory Visit: Payer: BC Managed Care – PPO

## 2014-11-24 ENCOUNTER — Ambulatory Visit: Payer: BC Managed Care – PPO | Admitting: Oncology

## 2014-12-08 ENCOUNTER — Other Ambulatory Visit (HOSPITAL_COMMUNITY): Payer: Self-pay | Admitting: Obstetrics & Gynecology

## 2014-12-09 LAB — CYTOLOGY - PAP

## 2014-12-23 ENCOUNTER — Other Ambulatory Visit: Payer: Self-pay | Admitting: Obstetrics & Gynecology

## 2014-12-24 ENCOUNTER — Other Ambulatory Visit (HOSPITAL_BASED_OUTPATIENT_CLINIC_OR_DEPARTMENT_OTHER): Payer: BLUE CROSS/BLUE SHIELD

## 2014-12-24 DIAGNOSIS — C50312 Malignant neoplasm of lower-inner quadrant of left female breast: Secondary | ICD-10-CM

## 2014-12-24 LAB — CBC WITH DIFFERENTIAL/PLATELET
BASO%: 0.5 % (ref 0.0–2.0)
BASOS ABS: 0 10*3/uL (ref 0.0–0.1)
EOS%: 4.7 % (ref 0.0–7.0)
Eosinophils Absolute: 0.3 10*3/uL (ref 0.0–0.5)
HEMATOCRIT: 40.6 % (ref 34.8–46.6)
HEMOGLOBIN: 13.8 g/dL (ref 11.6–15.9)
LYMPH%: 22.2 % (ref 14.0–49.7)
MCH: 31.2 pg (ref 25.1–34.0)
MCHC: 33.9 g/dL (ref 31.5–36.0)
MCV: 91.9 fL (ref 79.5–101.0)
MONO#: 0.5 10*3/uL (ref 0.1–0.9)
MONO%: 9.2 % (ref 0.0–14.0)
NEUT#: 3.6 10*3/uL (ref 1.5–6.5)
NEUT%: 63.4 % (ref 38.4–76.8)
PLATELETS: 188 10*3/uL (ref 145–400)
RBC: 4.42 10*6/uL (ref 3.70–5.45)
RDW: 13.2 % (ref 11.2–14.5)
WBC: 5.7 10*3/uL (ref 3.9–10.3)
lymph#: 1.3 10*3/uL (ref 0.9–3.3)

## 2014-12-24 LAB — COMPREHENSIVE METABOLIC PANEL (CC13)
ALK PHOS: 58 U/L (ref 40–150)
ALT: 19 U/L (ref 0–55)
AST: 21 U/L (ref 5–34)
Albumin: 3.5 g/dL (ref 3.5–5.0)
Anion Gap: 9 mEq/L (ref 3–11)
BILIRUBIN TOTAL: 0.3 mg/dL (ref 0.20–1.20)
BUN: 16.1 mg/dL (ref 7.0–26.0)
CO2: 26 mEq/L (ref 22–29)
Calcium: 9.1 mg/dL (ref 8.4–10.4)
Chloride: 106 mEq/L (ref 98–109)
Creatinine: 0.9 mg/dL (ref 0.6–1.1)
EGFR: 73 mL/min/{1.73_m2} — AB (ref >90)
GLUCOSE: 127 mg/dL (ref 70–140)
Potassium: 4.6 mEq/L (ref 3.5–5.1)
SODIUM: 141 meq/L (ref 136–145)
Total Protein: 6.6 g/dL (ref 6.4–8.3)

## 2014-12-31 ENCOUNTER — Ambulatory Visit (HOSPITAL_BASED_OUTPATIENT_CLINIC_OR_DEPARTMENT_OTHER): Payer: BLUE CROSS/BLUE SHIELD | Admitting: Oncology

## 2014-12-31 ENCOUNTER — Encounter: Payer: Self-pay | Admitting: Oncology

## 2014-12-31 ENCOUNTER — Telehealth: Payer: Self-pay | Admitting: Nurse Practitioner

## 2014-12-31 VITALS — BP 134/75 | HR 70 | Temp 97.7°F | Resp 18 | Ht 64.0 in | Wt 203.4 lb

## 2014-12-31 DIAGNOSIS — Z79810 Long term (current) use of selective estrogen receptor modulators (SERMs): Secondary | ICD-10-CM

## 2014-12-31 DIAGNOSIS — C50312 Malignant neoplasm of lower-inner quadrant of left female breast: Secondary | ICD-10-CM | POA: Diagnosis not present

## 2014-12-31 DIAGNOSIS — Z170 Estrogen receptor positive status [ER+]: Secondary | ICD-10-CM | POA: Diagnosis not present

## 2014-12-31 DIAGNOSIS — Z8481 Family history of carrier of genetic disease: Secondary | ICD-10-CM

## 2014-12-31 DIAGNOSIS — G4733 Obstructive sleep apnea (adult) (pediatric): Secondary | ICD-10-CM

## 2014-12-31 NOTE — Progress Notes (Signed)
[facility]  [phone] [phone]     ID: Connie [name] DOB: 09-23-1950  MR#: [id number]  EHU#:[id number]  PCP: Marton Redwood, MD GYN: [name] SU: [name] OTHER MD: [name]. [name]  CHIEF COMPLAINT: Estrogen receptor positive breast cancer  CURRENT TREATMENT: Adjuvant tamoxifen   BREAST CANCER HISTORY: From the original intake note:  The patient herself palpated a mass in her left breast around Christmastime 2014 but was about to undergo knee surgery at the time and did not immediately followup on this. On 11/06/2013 she brought it to Dr. [name] attention and he scheduled her for bilateral diagnostic mammography and left ultrasonography at the breast Center. This showed a 2.4 cm mass in the left lower inner quadrant, with lobulated margins. This was palpable and appeared fixed. Ultrasound confirmed an irregular hypoechoic solid mass at the 8:30 position 5 cm from the nipple measuring 2.1 cm. Medial to this was an oval hypoechoic mass measuring 0.7 cm and inferior to this there were at least 2 additional hypoechoic nodules, the largest measuring 0.8 cm. Ultrasound of the left axilla was negative.  Ultrasound-guided biopsy of the 2 more prominent left breast masses 11/13/2013 showed ([id number]) one of the masses to be benign. The other one was an invasive ductal carcinoma, grade 1 estrogen receptor 100% positive, progesterone receptor 91% positive, both with strong staining intensity, with an MIB-1 of 89%, and no HER-2 amplification, the signals ratio being 0.93 and the number per cell 1.95.  On 11/21/2013 the patient underwent bilateral breast MRI. This showed, in the 8:00 position of the left breast, a 2.6 cm enhancing mass with irregular borders. Lateral to this there was a second biopsy marker clip surrounded by minimal enhancement. There was no mass in that area. Ultrasound had shown evidence of possible satellite masses  inferior to the dominant mass but these were not seen by MRI. There were no other findings, the right breast was normal, and the left axilla showed no abnormal notes.  On 12/05/2013 the patient underwent left lumpectomy and sentinel lymph node sampling. This showed a 2.9 cm invasive ductal carcinoma, grade 3, less than a millimeter from the closest margin. 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 Estrogen receptor and progesterone receptor studies were obtained on the DCIS and are positive at 100% for the estrogen receptor and 39%, progesterone receptor, both with strong staining intensity.  On 01/01/2014 the patient underwent additional surgery for margin clearance. This showed no residual tumor.  In addition, an Oncotype scan was obtained from the invasive tumor (lumpectomy specimen) and showed a score of 15, predicting a ten-year risk of outside the breast recurrence of 10% if the patient's only systemic treatment was tamoxifen for 5 years. It also predicted no benefit from chemotherapy.  The patient was evaluated by Dr. Pablo Ledger and started radiation treatments 02/11/2014. She also met with genetics, since there was a documented BRCA 2 mutation in a first cousin. The patient was tested specifically for this mutation, and was found to be negative.  Her subsequent history is as detailed below  INTERVAL HISTORY: Connie [name] returns today for follow-up of her breast cancer. She continues on tamoxifen, with hot flashes as the main problem. These are "a little better". She obtains a drug at a good price.Marland Kitchen  REVIEW OF SYSTEMS: Overall she is doing "fine". She went to [facility] with her husband to photograph the [employer] and she went camping with her daughter  through the "sisters on the fly" group. She continues to have occasional proximal with back pain but this is not more intense or persistent than before. There have not been any intercurrent seizures. Her sleep  apnea is stable. A detailed review of systems today was otherwise noncontributory  PAST MEDICAL HISTORY: Past Medical History  Diagnosis Date  . IBS (irritable bowel syndrome)   . GERD (gastroesophageal reflux disease)   . High cholesterol   . Depression   . PLMD (periodic limb movement disorder) 04/30/2013  . PVC's (premature ventricular contractions) 04/30/2013  . Bronchitis, chronic   . Headache(784.0)     MIGRAINES  . Meniscus tear     LEFT x 2  . Arthritis     L FOOT  . OSA (obstructive sleep apnea) 04/30/2013    USES C -PAP  . Pneumonia   . Anxiety   . Diverticulitis   . History of shingles Feb. 2015  . Wears glasses   . Cancer     left breast cancer  . Breast cancer 11/13/13    left breast invasive ductal ca,  . Radiation 02/06/14-03/24/14    Left breast ductal cancer    PAST SURGICAL HISTORY: Past Surgical History  Procedure Laterality Date  . Knee surgery Left 2000-2001     RT KNEE X1  . Appendectomy  1984  . Osteotomy      RT KNEE  . Knee arthroscopy Left 08/28/2013    Procedure: LEFT KNEE ARTHROSCOPY WITH MEDIAL AND LATERAL DEBRIDEMENT;  Surgeon: Gearlean Alf, MD;  Location: [facility];  Service: Orthopedics;  Laterality: Left;  . Dilation and curettage of uterus    . Breast lumpectomy Left 12/05/2013    Procedure: BREAST LUMPECTOMY WITH EXCISION OF SENTINEL NODE;  Surgeon: [name], MD;  Location: [facility];  Service: General;  Laterality: Left;  . Re-excision of breast lumpectomy Left 01/01/2014    Procedure: RE-EXCISION OF left BREAST cancer ;  Surgeon: [name], MD;  Location: [facility];  Service: General;  Laterality: Left;    FAMILY HISTORY Family History  Problem Relation Age of Onset  . Cancer Father 23    esophageal and prostate cancer - smoker  . Cancer Paternal Aunt     breast at unknown age  . Cancer Cousin 78    Breast cancer in a paternal first cousin; BRCA2 + , IVS5-2A>G. Tested at [facility]  . Cancer Cousin 41    breast  cancer in a paternal first cousin (sister to the cousin with the BRCA2 mutation)   the patient's father died from esophageal cancer at age 31. The patient's mother is living, at age 52. The patient had one brother, 2 sisters. One of the patient's father's 2 sisters was diagnosed with breast cancer and that aunt has 3 daughters, one of whom had breast cancer at an early age. The daughter was tested and was found to carry a BRCA2 mutation which Connie [name] however does not carry.  GYNECOLOGIC HISTORY:  No LMP recorded. Patient is postmenopausal. Menarche age 33, first live birth age 78, which the patient understands increases the risk of breast cancer. She stopped having periods approximately 10 years ago and has been on hormone replacement since that time, stopping in April of 2015.  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 She just retired. Her husband Frederico Hamman worked as an Clinical biochemist. He also recently retired.  This is his second marriage for both of them. She has 2 children from her first marriage, Gaylord Shih, who works in [employer] in [place], and [employer], who works in Pharmacologist and lives in [place]. Frederico Hamman has 2 children, Anderson Malta lives in [place] and [place] who lives in [place]. Anderson Malta is a stay-at-home mom Wendi Maya is a Education officer, museum. They have one grandson, who lives in [place]: In place   HEALTH MAINTENANCE: History  Substance Use Topics  . Smoking status: Never Smoker   . Smokeless tobacco: Never Used  . Alcohol Use: Yes     Comment: SOCIALLY     Colonoscopy: 2009, under lab our. She also had EGD at the same time given her father's history of esophageal cancer  PAP: 2014  Bone density: 2014 at [facility]; lowest T score was -0.9 (normal)  Lipid panel:  No Known Allergies  Current Outpatient Prescriptions  Medication Sig Dispense  Refill  . aspirin 81 MG tablet Take 81 mg by mouth daily.    Marland Kitchen atenolol (TENORMIN) 25 MG tablet Take 25 mg by mouth every morning.     Marland Kitchen atorvastatin (LIPITOR) 40 MG tablet Take 40 mg by mouth every evening.     Marland Kitchen b complex vitamins tablet Take 1 tablet by mouth daily.    . butalbital-aspirin-caffeine (FIORINAL) 50-325-40 MG per capsule Take 1-2 capsules by mouth 2 (two) times daily as needed for headache.     . cholecalciferol (VITAMIN D) 1000 UNITS tablet Take 5,000 Units by mouth daily.     . cloNIDine (CATAPRES-TTS-1) 0.1 mg/24hr patch Place 1 patch (0.1 mg total) onto the skin once a week. 12 patch 12  . cyclobenzaprine (FLEXERIL) 10 MG tablet Take 10 mg by mouth daily as needed for muscle spasms.    Marland Kitchen desvenlafaxine (PRISTIQ) 50 MG 24 hr tablet Take 100 mg by mouth daily.     . diclofenac sodium (VOLTAREN) 1 % GEL Apply 4 g topically 4 (four) times daily as needed. pain    . diphenhydrAMINE (BENADRYL) 25 MG tablet Take 50 mg by mouth at bedtime.    . fluticasone (FLONASE) 50 MCG/ACT nasal spray     . glucosamine-chondroitin 500-400 MG tablet Take 1 tablet by mouth 2 (two) times daily.    . hyaluronate sodium (RADIAPLEXRX) GEL Apply 1 application topically 2 (two) times daily. Apply to breast area after rad tx and bedtime daily & prn    . HYDROcodone-acetaminophen (NORCO) 5-325 MG per tablet Take 1-2 tablets by mouth every 4 (four) hours as needed for moderate pain. 30 tablet 0  . hyoscyamine (LEVSIN, ANASPAZ) 0.125 MG tablet     . magnesium oxide (MAG-OX) 400 MG tablet Take 800 mg by mouth daily.     . Multiple Vitamin (MULTIVITAMIN) tablet Take 1 tablet by mouth daily.    . non-metallic deodorant Jethro Poling) MISC Apply 1 application topically daily as needed.    Marland Kitchen omega-3 acid ethyl esters (LOVAZA) 1 G capsule Take 1 g by mouth 2 (two) times daily.    Marland Kitchen omeprazole (PRILOSEC OTC) 20 MG tablet Take 20 mg by mouth daily.    . tamoxifen (NOLVADEX) 20 MG tablet Take 1 tablet (20 mg total) by mouth  daily. 90 tablet 12  . UNABLE TO FIND [id number] post mastectomy camisole Qty: 2 L8000 Post-surgical bra: Qty: 6 A2505 Silicone Breast Prosthesis Qty: 1  Diagnosis Code 174.9 1 each    No current facility-administered medications for this visit.  OBJECTIVE: Middle-aged white woman who appears stated age 6 Vitals:   12/31/14 1151  BP: 134/75  Pulse: 70  Temp: 97.7 F (36.5 C)  Resp: 18     Body mass index is 34.9 kg/(m^2).    ECOG FS:1 - Symptomatic but completely ambulatory  Sclerae unicteric, EOMs intact Oropharynx clear, dentition in good repair No cervical or supraclavicular adenopathy Lungs no rales or rhonchi Heart regular rate and rhythm Abd soft, nontender, positive bowel sounds MSK no focal spinal tenderness, no upper extremity lymphedema Neuro: nonfocal, well oriented, appropriate affect Breasts: The right breast is unremarkable. The left breast is status post lumpectomy and radiation. There is no evidence of disease recurrence. The left axilla is benign.   LAB RESULTS:  CMP     Component Value Date/Time   NA 141 12/24/2014 0927   NA 141 12/03/2013 0939   K 4.6 12/24/2014 0927   K 4.5 12/03/2013 0939   CL 103 12/03/2013 0939   CO2 26 12/24/2014 0927   CO2 25 12/03/2013 0939   GLUCOSE 127 12/24/2014 0927   GLUCOSE 109* 12/03/2013 0939   BUN 16.1 12/24/2014 0927   BUN 12 12/03/2013 0939   CREATININE 0.9 12/24/2014 0927   CREATININE 0.79 12/03/2013 0939   CALCIUM 9.1 12/24/2014 0927   CALCIUM 9.8 12/03/2013 0939   PROT 6.6 12/24/2014 0927   PROT 7.0 12/15/2008 0743   ALBUMIN 3.5 12/24/2014 0927   ALBUMIN 3.8 12/15/2008 0743   AST 21 12/24/2014 0927   AST 30 12/15/2008 0743   ALT 19 12/24/2014 0927   ALT 19 12/15/2008 0743   ALKPHOS 58 12/24/2014 0927   ALKPHOS 68 12/15/2008 0743   BILITOT 0.30 12/24/2014 0927   BILITOT 0.8 12/15/2008 0743   GFRNONAA 88* 12/03/2013 0939   GFRAA >90 12/03/2013 0939    I No results found for: SPEP  Lab Results   Component Value Date   WBC 5.7 12/24/2014   NEUTROABS 3.6 12/24/2014   HGB 13.8 12/24/2014   HCT 40.6 12/24/2014   MCV 91.9 12/24/2014   PLT 188 12/24/2014      Chemistry      Component Value Date/Time   NA 141 12/24/2014 0927   NA 141 12/03/2013 0939   K 4.6 12/24/2014 0927   K 4.5 12/03/2013 0939   CL 103 12/03/2013 0939   CO2 26 12/24/2014 0927   CO2 25 12/03/2013 0939   BUN 16.1 12/24/2014 0927   BUN 12 12/03/2013 0939   CREATININE 0.9 12/24/2014 0927   CREATININE 0.79 12/03/2013 0939      Component Value Date/Time   CALCIUM 9.1 12/24/2014 0927   CALCIUM 9.8 12/03/2013 0939   ALKPHOS 58 12/24/2014 0927   ALKPHOS 68 12/15/2008 0743   AST 21 12/24/2014 0927   AST 30 12/15/2008 0743   ALT 19 12/24/2014 0927   ALT 19 12/15/2008 0743   BILITOT 0.30 12/24/2014 0927   BILITOT 0.8 12/15/2008 0743       No results found for: LABCA2  No components found for: [id number]  No results for input(s): INR in the last 168 hours.  Urinalysis    Component Value Date/Time   COLORURINE YELLOW 12/15/2008 0701   APPEARANCEUR HAZY* 12/15/2008 0701   LABSPEC 1.031* 12/15/2008 0701   PHURINE 5.5 12/15/2008 0701   GLUCOSEU NEGATIVE 12/15/2008 0701   HGBUR TRACE* 12/15/2008 0701   BILIRUBINUR NEGATIVE 12/15/2008 0701   KETONESUR NEGATIVE 12/15/2008 0701   PROTEINUR NEGATIVE 12/15/2008 0701   UROBILINOGEN 0.2 12/15/2008  0701   NITRITE NEGATIVE 12/15/2008 0701   LEUKOCYTESUR NEGATIVE 12/15/2008 0701    STUDIES: Patient states she had a bone density through Dr. Raul Del office which was normal  ASSESSMENT: 64 y.o. [place] woman s/p left breast biopsy 11/13/2013 for a clinical T2 N0, stage IIA invasive ductal carcinoma, grade 1, estrogen and progesterone receptor positive, HER-2 negative, with an MIB-1 of 89%  (1) status post left lumpectomy and sentinel lymph node sampling 12/05/2013 for a pT2 pN0, stage IIA invasive ductal carcinoma, grade 2, together with ductal carcinoma in  situ, which was also estrogen and progesterone receptor positive margins were involved  (2) status post additional surgery 01/01/2014 for margin clearance, showing no residual tumor  (3) Oncotype score of 15 predicted in a 10 year outside the breast risk of recurrence of 10% if the patient's only systemic treatment was tamoxifen for 5 years. It also predicted no benefit from chemotherapy  (4) adjuvant radiation completed 03/24/2014  (5) tamoxifen started 04/11/2014  (6) the patient was tested for a BRCA2 mutation documented in other family members, c.[id number]>G (IVS5-2A>G). She does not carry this mutation   PLAN: Connie [name] is doing well from a breast cancer point of view, now a year out from her definitive surgery with no evidence of disease recurrence. The plan is to continue tamoxifen at least another year, and then consider whether she would like to switch to an aromatase inhibitor so she can complete an optimal program of breast cancer risk reduction in 5 years.  The clonidine patch is helping the hot flash problem. With a little bit more time I think this should continue to fade. At present we are not adding any other drugs for this indication.  She tells me she will be seeing Dr. [name] in July and Dr. Stann Mainland next May. She will see me this October. She knows to call for any problems that may develop before that visit.  Chauncey Cruel, MD   12/31/2014 12:24 PM

## 2014-12-31 NOTE — Telephone Encounter (Signed)
Appointments made and avs printed for patient °

## 2015-03-12 ENCOUNTER — Telehealth: Payer: Self-pay | Admitting: Neurology

## 2015-03-12 NOTE — Telephone Encounter (Signed)
I spoke to [facility], they have no record of calling us. I advised them that we have not seen the patient since 2014

## 2015-03-12 NOTE — Telephone Encounter (Signed)
Monique with [employer] called requesting to confirm directions on RX for CPAP. She can be reached at [phone].

## 2015-03-18 NOTE — Telephone Encounter (Signed)
Patient called regarding Rx for CPAP supplies. She has changed companies for CPAP supplies and is now going to use [employer]. Pt needs new script.

## 2015-03-19 NOTE — Telephone Encounter (Signed)
I spoke to patient and she is aware that she will need to come in for an appt before orders will be given. She did not want to make appt today since [place]. She states that she will call back to make appt.

## 2015-03-25 IMAGING — MG MM DIGITAL DIAGNOSTIC UNILAT*L*
2 series · 2 of 2 positions shown · non-contrast
Comparison: Previous exams.

CLINICAL DATA: Left breast masses

EXAM:
POST-BIOPSY CLIP PLACEMENT LEFT DIAGNOSTIC MAMMOGRAM

[L CC]
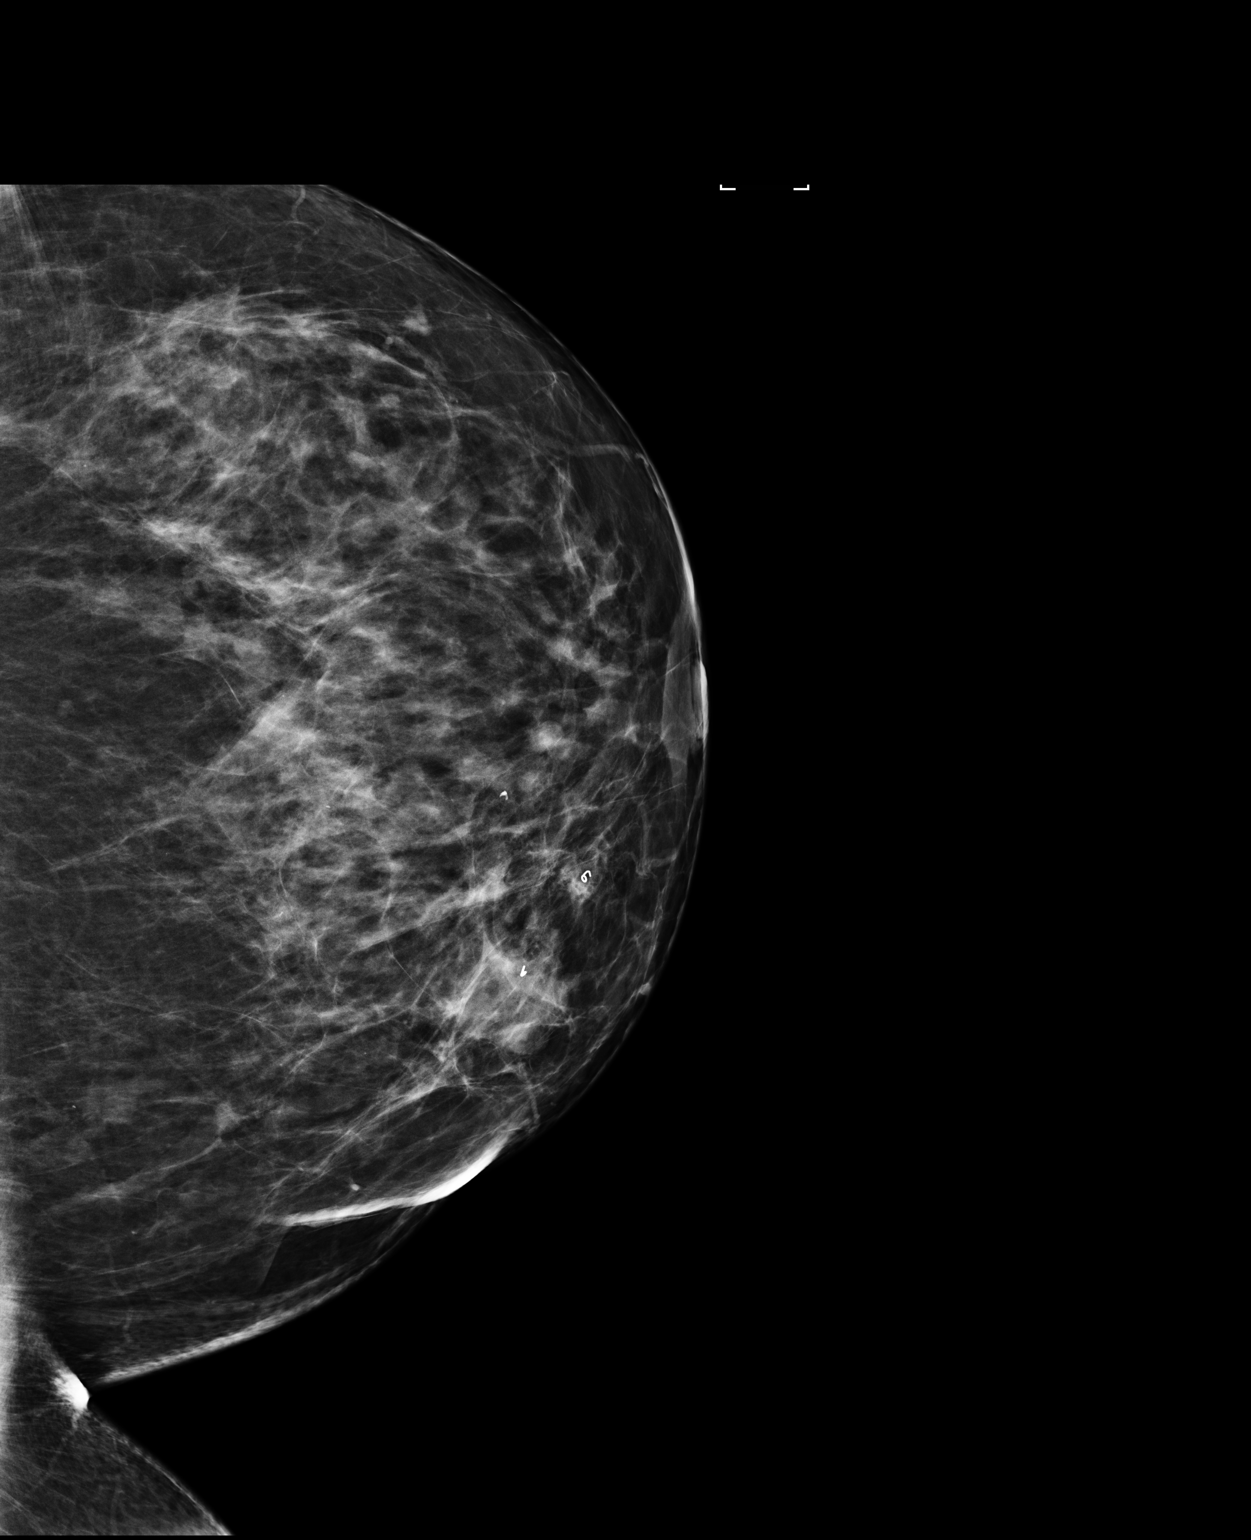

[L ML]
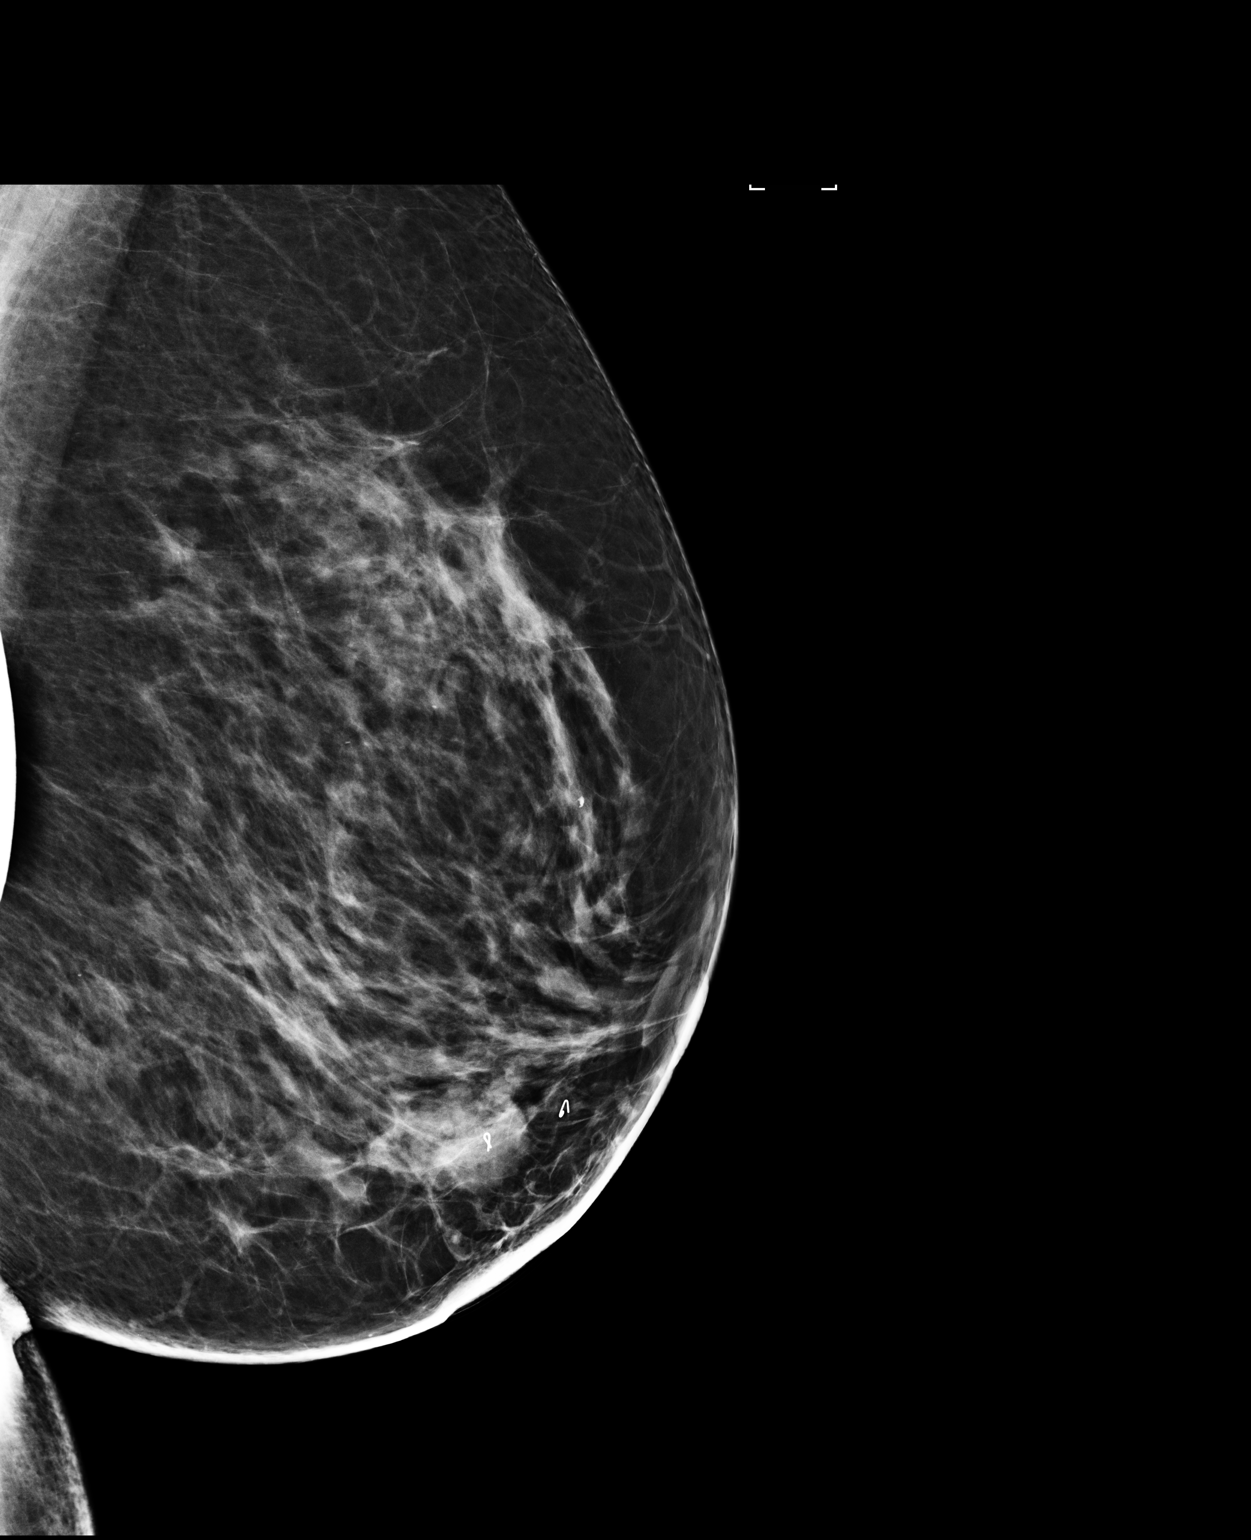

[2 of 2 positions shown; findings below may reference images not displayed]

FINDINGS: Films are performed following ultrasound guided biopsy of left
breast masses. Images show a ribbon type clip in association with a
dominant, palpable mass in the lower inner quadrant of the right
breast, anteriorly an appropriate position (site 1 of 2). Images
show a wing type clip in association with a satellite mass in the
lower inner quadrant, medial to the dominant mass (site 2 of 2).
Clip is also in appropriate position.
IMPRESSION: Adequate clip placement follow ultrasound-guided left breast
biopsies.

Final Assessment: Post Procedure Mammograms for Marker Placement

## 2015-04-19 ENCOUNTER — Other Ambulatory Visit: Payer: Self-pay | Admitting: Oncology

## 2015-05-06 ENCOUNTER — Other Ambulatory Visit: Payer: BLUE CROSS/BLUE SHIELD

## 2015-05-07 ENCOUNTER — Telehealth: Payer: Self-pay

## 2015-05-07 NOTE — Telephone Encounter (Signed)
I spoke to patient and reminded her of appt on Monday. I asked her to bring in the memory card to her CPAP machine with her.

## 2015-05-11 ENCOUNTER — Ambulatory Visit (INDEPENDENT_AMBULATORY_CARE_PROVIDER_SITE_OTHER): Payer: BLUE CROSS/BLUE SHIELD | Admitting: Neurology

## 2015-05-11 ENCOUNTER — Encounter: Payer: Self-pay | Admitting: Neurology

## 2015-05-11 VITALS — BP 110/64 | HR 72 | Resp 16 | Ht 64.0 in | Wt 205.0 lb

## 2015-05-11 DIAGNOSIS — G4733 Obstructive sleep apnea (adult) (pediatric): Secondary | ICD-10-CM | POA: Diagnosis not present

## 2015-05-11 DIAGNOSIS — Z9989 Dependence on other enabling machines and devices: Principal | ICD-10-CM

## 2015-05-11 NOTE — Progress Notes (Signed)
Subjective:    Patient ID: Connie [name] is a 64 y.o. female.  HPI     Interim history:  Connie [name] is a very pleasant 64 year old right-handed woman with an underlying medical history of hypertension, lung disease, osteoarthritis, reflux disease, hyperlipidemia, depression, migraines, IBS and obesity, who presents for FU consultation of her obstructive sleep apnea, on treatment with CPAP. She is unaccompanied today. I last saw her on 06/10/2013, at which time she reported feeling better and tolerating CPAP. She was compliant with treatment and felt, it was helpful.  Today, 05/11/2015: Reviewed her CPAP compliance data from 04/11/2015 through 05/10/2015 which is a total of 30 days during which time she used her machine every night with percent used days greater than 4 hours at 100%, indicating superb compliance with an average usage of 8 hours and 1 minute, residual AHI low at 1.2 per hour, leak very low with an average time of 8 seconds of leak per day, pressure at 8 cm.  Today, 05/11/2015: She reports doing well with CPAP. She needed to switch DME providers to [facility] as her original DME company went out of business. Unfortunately, in the interim, she has been diagnosed with breast cancer on the left side. She had lumpectomy on 12/05/2013. She had additional surgery on 01/01/2014. She has been on tamoxifen and had radiation therapy. She has had some hot flashes. She is taking medication for this with reasonable success. She is trying to stay active. Her weight has been stable. Her mother has been diagnosed with Lewy body dementia and this has been an additional stressor to her family. The patient has 2 sisters and 1 brother in [place] in the [place] area where her mom lives. She had knee surgery in 2015. She has residual knee pain bilaterally. Unfortunately, she developed shingles on the left side of the thorax last year as well. 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   Previously:  I saw  her on 04/30/2013, which time we discussed her sleep study findings and I suggested a trial of CPAP in the form of AutoPap. She was agreeable to this and I wanted to see her back after about a month of trying CPAP. I also reviewed compliance data in the interim from 05/03/2013 to 05/25/2013 which is a total of 23 days during which time she uses CPAP every day. Percent used days greater than 4 hours was 100%. Her CPAP mean pressure was 6.8, the 90th percentile was 8.2 cm. Her residual AHI was 0.7 per hour. Her average usage for all days was 7 hours and 38 minutes. This indicates excellent compliance. Also, based on the pressure summary I changed her CPAP setting to a set pressure of 8 cm. She uses a nasal pillows and tolerates it well. She sleeps better and feels better rested. She had no changes in her medical Hx of medications.   Her pressure will be adjusted in 2 days from now. She has an appointment with her DME company. I first met her on 03/06/2013, which time she reported daytime somnolence and snoring. 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 Based on her history and exam I felt she had findings concerning for OSA and asked her to come back for sleep study. She had a diagnostic polysomnogram on 03/08/2013 and I went over her test results with her last time. Sleep efficiency was normal at 91.1% with a latency to sleep of 17 minutes. Wake  after sleep onset was 27 minutes with moderate to severe sleep fragmentation noted. She had an increased percentage of stage I and 2 sleep, near absence of slow-wave sleep and a decreased percentage of REM sleep at 10.7% with a highly prolonged REM latency of 245 minutes. She had mild periodic leg movements at 18.6 per hour resulting in very mild arousals of 3.3 per hour. She had occasional PVCs. She had mild snoring. She had a total of 39 obstructive hypopneas, rendering a borderline elevated AHI of 5.2 events per hour, with further elevation to 19.8 per  hour in REM sleep. Her baseline oxygen saturation was 94%, her nadir was 85% and she spent 13 minutes and 8 seconds below the saturation of 90%.   She denies any recent chest pain or shortness of breath or palpitations. Her primary care physician checks her EKG regularly. She has no symptoms from her occasional PVCs that we saw on the sleep study.  Her Past Medical History Is Significant For: Past Medical History  Diagnosis Date  . IBS (irritable bowel syndrome)   . GERD (gastroesophageal reflux disease)   . High cholesterol   . Depression   . PLMD (periodic limb movement disorder) 04/30/2013  . PVC's (premature ventricular contractions) 04/30/2013  . Bronchitis, chronic ([place])   . Headache(784.0)     MIGRAINES  . Meniscus tear     LEFT x 2  . Arthritis     L FOOT  . OSA (obstructive sleep apnea) 04/30/2013    USES C -PAP  . Pneumonia   . Anxiety   . Diverticulitis   . History of shingles Feb. 2015  . Wears glasses   . Cancer [facility])     left breast cancer  . Breast cancer ([place]) 11/13/13    left breast invasive ductal ca,  . Radiation 02/06/14-03/24/14    Left breast ductal cancer    Her Past Surgical History Is Significant For: Past Surgical History  Procedure Laterality Date  . Knee surgery Left 2000-2001     RT KNEE X1  . Appendectomy  1984  . Osteotomy      RT KNEE  . Knee arthroscopy Left 08/28/2013    Procedure: LEFT KNEE ARTHROSCOPY WITH MEDIAL AND LATERAL DEBRIDEMENT;  Surgeon: Gearlean Alf, MD;  Location: [facility];  Service: Orthopedics;  Laterality: Left;  . Dilation and curettage of uterus    . Breast lumpectomy Left 12/05/2013    Procedure: BREAST LUMPECTOMY WITH EXCISION OF SENTINEL NODE;  Surgeon: [name], MD;  Location: [place];  Service: General;  Laterality: Left;  . Re-excision of breast lumpectomy Left 01/01/2014    Procedure: RE-EXCISION OF left BREAST cancer ;  Surgeon: [name], MD;  Location: [place];  Service: General;  Laterality:  Left;    Her Family History Is Significant For: Family History  Problem Relation Age of Onset  . Cancer Father 40    esophageal and prostate cancer - smoker  . Cancer Paternal Aunt     breast at unknown age  . Cancer Cousin 75    Breast cancer in a paternal first cousin; BRCA2 + , IVS5-2A>G. Tested at [employer] [facility]  . Cancer Cousin 78    breast cancer in a paternal first cousin (sister to the cousin with the BRCA2 mutation)    Her Social History Is Significant For: Social History   Social History  . Marital Status: Married    Spouse Name: N/A  . Number of Children: 4  .  Years of Education: college   Occupational History  . [employer]    Social History Main Topics  . Smoking status: Never Smoker   . Smokeless tobacco: Never Used  . Alcohol Use: Yes     Comment: SOCIALLY  . Drug Use: No  . Sexual Activity: Not Asked   Other Topics Concern  . None   Social History Narrative    Her Allergies Are:  No Known Allergies:   Her Current Medications Are:  Outpatient Encounter Prescriptions as of 05/11/2015  Medication Sig  . ACETAMINOPHEN-BUTALBITAL 50-325 MG TABS   . aspirin 81 MG tablet Take 81 mg by mouth daily.  Marland Kitchen atenolol (TENORMIN) 25 MG tablet Take 25 mg by mouth every morning.   Marland Kitchen atorvastatin (LIPITOR) 40 MG tablet Take 40 mg by mouth every evening.   Marland Kitchen b complex vitamins tablet Take 1 tablet by mouth daily.  . butalbital-aspirin-caffeine (FIORINAL) 50-325-40 MG per capsule Take 1-2 capsules by mouth 2 (two) times daily as needed for headache.   . cloNIDine (CATAPRES-TTS-1) 0.1 mg/24hr patch Place 1 patch (0.1 mg total) onto the skin once a week.  . cyclobenzaprine (FLEXERIL) 10 MG tablet Take 10 mg by mouth daily as needed for muscle spasms.  Marland Kitchen desvenlafaxine (PRISTIQ) 50 MG 24 hr tablet Take 100 mg by mouth daily.   . diclofenac sodium (VOLTAREN) 1 % GEL Apply 4 g topically 4 (four) times daily as needed. pain  . diphenhydrAMINE (BENADRYL) 25 MG tablet  Take 50 mg by mouth at bedtime.  . fluticasone (FLONASE) 50 MCG/ACT nasal spray   . glucosamine-chondroitin 500-400 MG tablet Take 1 tablet by mouth 2 (two) times daily.  . hyoscyamine (LEVSIN, ANASPAZ) 0.125 MG tablet   . magnesium oxide (MAG-OX) 400 MG tablet Take 800 mg by mouth daily.   . Multiple Vitamin (MULTIVITAMIN) tablet Take 1 tablet by mouth daily.  Marland Kitchen PRISTIQ 100 MG 24 hr tablet   . tamoxifen (NOLVADEX) 20 MG tablet TAKE 1 TABLET DAILY  . traMADol (ULTRAM) 50 MG tablet   . [DISCONTINUED] non-metallic deodorant (ALRA) MISC Apply 1 application topically daily as needed.  . [DISCONTINUED] omega-3 acid ethyl esters (LOVAZA) 1 G capsule Take 1 g by mouth 2 (two) times daily.   No facility-administered encounter medications on file as of 05/11/2015.  :  Review of Systems:  Out of a complete 14 point review of systems, all are reviewed and negative with the exception of these symptoms as listed below:  Review of Systems  Neurological:       Patient is here for CPAP f/u. She was using [facility] for CPAP supplies, but they are out of business. Patent would like to use [facility] now.  No new concerns.     Objective:  Neurologic Exam  Physical Exam Physical Examination:   Filed Vitals:   05/11/15 1514  BP: 110/64  Pulse: 72  Resp: 16   General Examination: The patient is a very pleasant 64 y.o. female in no acute distress. She appears well-developed and well-nourished and well groomed.  she is in good spirits today.  HEENT: Normocephalic, atraumatic, pupils are equal, round and reactive to light and accommodation.  she has mild bilateral cataracts. Extraocular tracking is good without limitation to gaze excursion or nystagmus noted. Normal smooth pursuit is noted. Hearing is grossly intact. Face is symmetric with normal facial animation and normal facial sensation. Speech is clear with no dysarthria noted. There is no hypophonia. There is no lip, neck/head, jaw  or voice tremor. Neck  is supple with full range of passive and active motion. There are no carotid bruits on auscultation. Oropharynx exam reveals: mild mouth dryness, adequate dental hygiene and moderate airway crowding, due to narrow airway, tonsillar size of 1+ and elongated uvula. Mallampati is class II. Tongue protrudes centrally and palate elevates symmetrically. She has a fairly significant overbite.   Chest: Clear to auscultation without wheezing, rhonchi or crackles noted.  Heart: S1+S2+0, regular and normal without murmurs, rubs or gallops noted.   Abdomen: Soft, non-tender and non-distended with normal bowel sounds appreciated on auscultation.  Extremities: There is no pitting edema in the distal lower extremities bilaterally. Pedal pulses are intact.  Skin: Warm and dry without trophic changes noted. There are no varicose veins.  Musculoskeletal: exam reveals no obvious joint deformities, tenderness or joint swelling or erythema.   Neurologically:  Mental status: The patient is awake, alert and oriented in all 4 spheres. Her memory, attention, language and knowledge are appropriate. There is no aphasia, agnosia, apraxia or anomia. Speech is clear with normal prosody and enunciation. Thought process is linear. Mood is congruent and affect is normal.  Cranial nerves are as described above under HEENT exam.  Motor exam: Normal bulk, strength and tone is noted. There is no drift, tremor or rebound. Romberg is negative. Reflexes are 2+ throughout. Fine motor skills are intact.  heel to shin and finger to nose are unremarkable. Cerebellar testing shows no dysmetria or intention tremor. There is no truncal or gait ataxia.  Sensory exam is intact to light touch.  Gait, station and balance are unremarkable. No veering to one side is noted. No leaning to one side is noted. Posture is age-appropriate and stance is narrow based. No problems turning are noted. She turns en bloc. Tandem walk is unremarkable. Assessment  and Plan:   In summary, Connie [name] is a very pleasant 64 year old female with a history of OSA, now on CPAP treatment with a pressure of 8 cm and full compliance. She continues to do well with it.  she is commended for being fully compliant with CPAP therapy. She continues to indicate good results with it. She has no new complaints as far as her sleep is concerned. She is aware that she needs to lose weight and is trying. She is advised to stay well-hydrated and use her CPAP regularly. She needs new supplies and we will send the updated order to her new DME company, [facility]. I reviewed the compliance data with the patient and encouraged her to continue to use CPAP regularly to help reduce cardiovascular risk and, of course, to continue to help her sleep related symptoms.  I would like to see her back for her yearly checkup next year. I answered all her questions today and the patient was in agreement. I spent 20 minutes in total face-to-face time with the patient, more than 50% of which was spent in counseling and coordination of care, reviewing test results, reviewing medication and discussing or reviewing the diagnosis of OSA, its prognosis and treatment options.

## 2015-05-11 NOTE — Patient Instructions (Signed)
Please continue using your CPAP regularly. While your insurance requires that you use CPAP at least 4 hours each night on 70% of the nights, I recommend, that you not skip any nights and use it throughout the night if you can. Getting used to CPAP and staying with the treatment long term does take time and patience and discipline. Untreated obstructive sleep apnea when it is moderate to severe can have an adverse impact on cardiovascular health and raise her risk for heart disease, arrhythmias, hypertension, congestive heart failure, stroke and diabetes. Untreated obstructive sleep apnea causes sleep disruption, nonrestorative sleep, and sleep deprivation. This can have an impact on your day to day functioning and cause daytime sleepiness and impairment of cognitive function, memory loss, mood disturbance, and problems focussing. Using CPAP regularly can improve these symptoms.  Keep up the good work! I will see you back in one year for sleep apnea check up.

## 2015-05-13 ENCOUNTER — Ambulatory Visit: Payer: BLUE CROSS/BLUE SHIELD | Admitting: Nurse Practitioner

## 2015-05-13 ENCOUNTER — Other Ambulatory Visit (HOSPITAL_BASED_OUTPATIENT_CLINIC_OR_DEPARTMENT_OTHER): Payer: BLUE CROSS/BLUE SHIELD

## 2015-05-13 ENCOUNTER — Other Ambulatory Visit: Payer: Self-pay | Admitting: Nurse Practitioner

## 2015-05-13 DIAGNOSIS — C50312 Malignant neoplasm of lower-inner quadrant of left female breast: Secondary | ICD-10-CM

## 2015-05-13 LAB — CBC WITH DIFFERENTIAL/PLATELET
BASO%: 0.6 % (ref 0.0–2.0)
Basophils Absolute: 0 10*3/uL (ref 0.0–0.1)
EOS%: 4.3 % (ref 0.0–7.0)
Eosinophils Absolute: 0.2 10*3/uL (ref 0.0–0.5)
HCT: 40.6 % (ref 34.8–46.6)
HGB: 13.4 g/dL (ref 11.6–15.9)
LYMPH%: 26.7 % (ref 14.0–49.7)
MCH: 30.2 pg (ref 25.1–34.0)
MCHC: 33 g/dL (ref 31.5–36.0)
MCV: 91.5 fL (ref 79.5–101.0)
MONO#: 0.4 10*3/uL (ref 0.1–0.9)
MONO%: 8.8 % (ref 0.0–14.0)
NEUT#: 3 10*3/uL (ref 1.5–6.5)
NEUT%: 59.6 % (ref 38.4–76.8)
Platelets: 191 10*3/uL (ref 145–400)
RBC: 4.43 10*6/uL (ref 3.70–5.45)
RDW: 13.6 % (ref 11.2–14.5)
WBC: 5 10*3/uL (ref 3.9–10.3)
lymph#: 1.3 10*3/uL (ref 0.9–3.3)

## 2015-05-13 LAB — COMPREHENSIVE METABOLIC PANEL (CC13)
ALT: 30 U/L (ref 0–55)
AST: 26 U/L (ref 5–34)
Albumin: 3.3 g/dL — ABNORMAL LOW (ref 3.5–5.0)
Alkaline Phosphatase: 69 U/L (ref 40–150)
Anion Gap: 9 mEq/L (ref 3–11)
BUN: 17.5 mg/dL (ref 7.0–26.0)
CO2: 26 mEq/L (ref 22–29)
Calcium: 9.7 mg/dL (ref 8.4–10.4)
Chloride: 107 mEq/L (ref 98–109)
Creatinine: 0.9 mg/dL (ref 0.6–1.1)
EGFR: 70 mL/min/{1.73_m2} — ABNORMAL LOW (ref >90)
Glucose: 108 mg/dl (ref 70–140)
Potassium: 4.6 mEq/L (ref 3.5–5.1)
Sodium: 141 mEq/L (ref 136–145)
Total Bilirubin: 0.37 mg/dL (ref 0.20–1.20)
Total Protein: 6.5 g/dL (ref 6.4–8.3)

## 2015-05-20 ENCOUNTER — Encounter: Payer: Self-pay | Admitting: Nurse Practitioner

## 2015-05-20 ENCOUNTER — Telehealth: Payer: Self-pay | Admitting: Oncology

## 2015-05-20 ENCOUNTER — Ambulatory Visit (HOSPITAL_BASED_OUTPATIENT_CLINIC_OR_DEPARTMENT_OTHER): Payer: BLUE CROSS/BLUE SHIELD | Admitting: Nurse Practitioner

## 2015-05-20 VITALS — BP 130/78 | HR 78 | Temp 98.6°F | Resp 18 | Wt 204.1 lb

## 2015-05-20 DIAGNOSIS — Z23 Encounter for immunization: Secondary | ICD-10-CM

## 2015-05-20 DIAGNOSIS — Z79810 Long term (current) use of selective estrogen receptor modulators (SERMs): Secondary | ICD-10-CM | POA: Diagnosis not present

## 2015-05-20 DIAGNOSIS — C50312 Malignant neoplasm of lower-inner quadrant of left female breast: Secondary | ICD-10-CM

## 2015-05-20 DIAGNOSIS — Z170 Estrogen receptor positive status [ER+]: Secondary | ICD-10-CM | POA: Diagnosis not present

## 2015-05-20 MED ORDER — INFLUENZA VAC SPLIT QUAD 0.5 ML IM SUSY
0.5000 mL | PREFILLED_SYRINGE | Freq: Once | INTRAMUSCULAR | Status: AC
  Administered 2015-05-20: 0.5 mL via INTRAMUSCULAR
  Filled 2015-05-20: qty 0.5

## 2015-05-20 NOTE — Progress Notes (Signed)
[place]  [phone] [phone]     ID: Connie [name] DOB: 1961/03/25  MR#: [id number]  RSW#:[id number]  PCP: Marton Redwood, MD GYN: [name] SU: [name] OTHER MD: [name]. [name]  CHIEF COMPLAINT: Estrogen receptor positive breast cancer  CURRENT TREATMENT: Adjuvant tamoxifen   BREAST CANCER HISTORY: From the original intake note:  The patient herself palpated a mass in her left breast around Christmastime 2014 but was about to undergo knee surgery at the time and did not immediately followup on this. On 11/06/2013 she brought it to Dr. [name] attention and he scheduled her for bilateral diagnostic mammography and left ultrasonography at the breast Center. This showed a 2.4 cm mass in the left lower inner quadrant, with lobulated margins. This was palpable and appeared fixed. Ultrasound confirmed an irregular hypoechoic solid mass at the 8:30 position 5 cm from the nipple measuring 2.1 cm. Medial to this was an oval hypoechoic mass measuring 0.7 cm and inferior to this there were at least 2 additional hypoechoic nodules, the largest measuring 0.8 cm. Ultrasound of the left axilla was negative.  Ultrasound-guided biopsy of the 2 more prominent left breast masses 11/13/2013 showed ([id number]) one of the masses to be benign. The other one was an invasive ductal carcinoma, grade 1 estrogen receptor 100% positive, progesterone receptor 91% positive, both with strong staining intensity, with an MIB-1 of 89%, and no HER-2 amplification, the signals ratio being 0.93 and the number per cell 1.95.  On 11/21/2013 the patient underwent bilateral breast MRI. This showed, in the 8:00 position of the left breast, a 2.6 cm enhancing mass with irregular borders. Lateral to this there was a second biopsy marker clip surrounded by minimal enhancement. There was no mass in that area. Ultrasound had shown evidence of possible satellite masses  inferior to the dominant mass but these were not seen by MRI. There were no other findings, the right breast was normal, and the left axilla showed no abnormal notes.  On 12/05/2013 the patient underwent left lumpectomy and sentinel lymph node sampling. This showed a 2.9 cm invasive ductal carcinoma, grade 3, less than a millimeter from the closest margin. 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 Estrogen receptor and progesterone receptor studies were obtained on the DCIS and are positive at 100% for the estrogen receptor and 39%, progesterone receptor, both with strong staining intensity.  On 01/01/2014 the patient underwent additional surgery for margin clearance. This showed no residual tumor.  In addition, an Oncotype scan was obtained from the invasive tumor (lumpectomy specimen) and showed a score of 15, predicting a ten-year risk of outside the breast recurrence of 10% if the patient's only systemic treatment was tamoxifen for 5 years. It also predicted no benefit from chemotherapy.  The patient was evaluated by Dr. Pablo Ledger and started radiation treatments 02/11/2014. She also met with genetics, since there was a documented BRCA 2 mutation in a first cousin. The patient was tested specifically for this mutation, and was found to be negative.  Her subsequent history is as detailed below  INTERVAL HISTORY: Connie [name] returns today for follow-up of her breast cancer. She has been on tamoxifen since September 2015 and tolerates this drug well besides hot flashes. Fortunately the clonidine patches added earlier this year are of immense help. She can go a few weeks with no hot flashes, but when it is getting close to having to change the patch they creep  back with a fair amount of intensity.  REVIEW OF SYSTEMS: Sachiko denies fevers, chills, nausea, or vomiting. She has some loose stools due to a digestive aid she gets from [employer]. Her appetite is good. She  has chronic bilateral knee pain but this does not keep her from being active. She has recently returned from a trip out [place] where she participated in hiking and walking activities at [facility] Dr. [name] gave her left knee an injection shortly before this trip and she was glad her did. She denies shortness of breath, chest pain, cough, or palpitations. Occasionally her ankles will swell after being on her feet for too long. She has a history of sleep apnea and uses a CPAP nightly . A detailed review of systems is otherwise stable. =  PAST MEDICAL HISTORY: Past Medical History  Diagnosis Date  . IBS (irritable bowel syndrome)   . GERD (gastroesophageal reflux disease)   . High cholesterol   . Depression   . PLMD (periodic limb movement disorder) 04/30/2013  . PVC's (premature ventricular contractions) 04/30/2013  . Bronchitis, chronic ([place])   . Headache(784.0)     MIGRAINES  . Meniscus tear     LEFT x 2  . Arthritis     L FOOT  . OSA (obstructive sleep apnea) 04/30/2013    USES C -PAP  . Pneumonia   . Anxiety   . Diverticulitis   . History of shingles Feb. 2015  . Wears glasses   . Cancer [facility])     left breast cancer  . Breast cancer ([place]) 11/13/13    left breast invasive ductal ca,  . Radiation 02/06/14-03/24/14    Left breast ductal cancer    PAST SURGICAL HISTORY: Past Surgical History  Procedure Laterality Date  . Knee surgery Left 2000-2001     RT KNEE X1  . Appendectomy  1984  . Osteotomy      RT KNEE  . Knee arthroscopy Left 08/28/2013    Procedure: LEFT KNEE ARTHROSCOPY WITH MEDIAL AND LATERAL DEBRIDEMENT;  Surgeon: Gearlean Alf, MD;  Location: [facility];  Service: Orthopedics;  Laterality: Left;  . Dilation and curettage of uterus    . Breast lumpectomy Left 12/05/2013    Procedure: BREAST LUMPECTOMY WITH EXCISION OF SENTINEL NODE;  Surgeon: [name], MD;  Location: [facility];  Service: General;  Laterality: Left;  . Re-excision of breast lumpectomy Left  01/01/2014    Procedure: RE-EXCISION OF left BREAST cancer ;  Surgeon: [name], MD;  Location: [place];  Service: General;  Laterality: Left;    FAMILY HISTORY Family History  Problem Relation Age of Onset  . Cancer Father 78    esophageal and prostate cancer - smoker  . Cancer Paternal Aunt     breast at unknown age  . Cancer Cousin 62    Breast cancer in a paternal first cousin; BRCA2 + , IVS5-2A>G. Tested at [facility]  . Cancer Cousin 95    breast cancer in a paternal first cousin (sister to the cousin with the BRCA2 mutation)   the patient's father died from esophageal cancer at age 89. The patient's mother is living, at age 50. The patient had one brother, 2 sisters. One of the patient's father's 2 sisters was diagnosed with breast cancer and that aunt has 3 daughters, one of whom had breast cancer at an early age. The daughter was tested and was found to carry a BRCA2 mutation which Anselma however  does not carry.  GYNECOLOGIC HISTORY:  No LMP recorded. Patient is postmenopausal. Menarche age 38, first live birth age 24, which the patient understands increases the risk of breast cancer. She stopped having periods approximately 10 years ago and has been on hormone replacement since that time, stopping in April of 2015.  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 She just retired. Her husband Frederico Hamman worked as an Clinical biochemist. He also recently retired. This is his second marriage for both of them. She has 2 children from her first marriage, Gaylord Shih, who works in [employer] in [place], and [employer], who works in Pharmacologist and lives in [place]. Frederico Hamman has 2 children, Anderson Malta lives in [place] and Henderson who lives in [place]. Anderson Malta is a stay-at-home mom Wendi Maya is a Education officer, museum. They have one grandson, who lives in [place]: In place   HEALTH MAINTENANCE: Social History  Substance Use Topics  . Smoking status: Never Smoker   . Smokeless tobacco: Never Used  . Alcohol Use: Yes     Comment: SOCIALLY     Colonoscopy: 2009, under lab our. She also had EGD at the same time given her father's history of esophageal cancer  PAP: 2014  Bone density: 2014 at [facility]; lowest T score was -0.9 (normal)  Lipid panel:  No Known Allergies  Current Outpatient Prescriptions  Medication Sig Dispense Refill  . aspirin 81 MG tablet Take 81 mg by mouth daily.    Marland Kitchen atenolol (TENORMIN) 25 MG tablet Take 25 mg by mouth every morning.     Marland Kitchen atorvastatin (LIPITOR) 40 MG tablet Take 40 mg by mouth every evening.     Marland Kitchen b complex vitamins tablet Take 1 tablet by mouth daily.    . butalbital-aspirin-caffeine (FIORINAL) 50-325-40 MG per capsule Take 1-2 capsules by mouth 2 (two) times daily as needed for headache.     . cloNIDine (CATAPRES-TTS-1) 0.1 mg/24hr patch Place 1 patch (0.1 mg total) onto the skin once a week. 12 patch 12  . cyclobenzaprine (FLEXERIL) 10 MG tablet Take 10 mg by mouth daily as needed for muscle spasms.    . diclofenac sodium (VOLTAREN) 1 % GEL Apply 4 g topically 4 (four) times daily as needed. pain    . diphenhydrAMINE (BENADRYL) 25 MG tablet Take 50 mg by mouth at bedtime.    . fluticasone (FLONASE) 50 MCG/ACT nasal spray     . glucosamine-chondroitin 500-400 MG tablet Take 1 tablet by mouth 2 (two) times daily.    . hyoscyamine (LEVSIN, ANASPAZ) 0.125 MG tablet     . magnesium oxide (MAG-OX) 400 MG tablet Take 800 mg by mouth daily. Pt takes [phone] mg daily for hotflashes.    . Multiple Vitamin (MULTIVITAMIN) tablet Take 1 tablet by mouth daily.    Marland Kitchen PRISTIQ 100 MG 24 hr tablet Take 100 mg by mouth daily.     . tamoxifen (NOLVADEX) 20 MG tablet TAKE 1 TABLET DAILY 90 tablet 11  . traMADol (ULTRAM) 50 MG tablet      Current Facility-Administered Medications  Medication Dose  Route Frequency Provider Last Rate Last Dose  . Influenza vac split quadrivalent PF (FLUARIX) injection 0.5 mL  0.5 mL Intramuscular Once Laurie Panda, NP        OBJECTIVE: Middle-aged white woman who appears stated age 64 Vitals:   05/20/15 1002  BP:  130/78  Pulse: 78  Temp: 98.6 F (37 C)  Resp: 18     Body mass index is 35.02 kg/(m^2).    ECOG FS:1 - Symptomatic but completely ambulatory  Skin: warm, dry  HEENT: sclerae anicteric, conjunctivae pink, oropharynx clear. No thrush or mucositis.  Lymph Nodes: No cervical or supraclavicular lymphadenopathy  Lungs: clear to auscultation bilaterally, no rales, wheezes, or rhonci  Heart: regular rate and rhythm  Abdomen: round, soft, non tender, positive bowel sounds  Musculoskeletal: No focal spinal tenderness, no peripheral edema  Neuro: non focal, well oriented, positive affect  Breasts: left breast status post lumpectomy and radiation. Palpable scar tissue along medial area of incision line. No evidence of recurrent disease. Left axilla benign. Right breast unremarkable.  LAB RESULTS:  CMP     Component Value Date/Time   NA 141 05/13/2015 0910   NA 141 12/03/2013 0939   K 4.6 05/13/2015 0910   K 4.5 12/03/2013 0939   CL 103 12/03/2013 0939   CO2 26 05/13/2015 0910   CO2 25 12/03/2013 0939   GLUCOSE 108 05/13/2015 0910   GLUCOSE 109* 12/03/2013 0939   BUN 17.5 05/13/2015 0910   BUN 12 12/03/2013 0939   CREATININE 0.9 05/13/2015 0910   CREATININE 0.79 12/03/2013 0939   CALCIUM 9.7 05/13/2015 0910   CALCIUM 9.8 12/03/2013 0939   PROT 6.5 05/13/2015 0910   PROT 7.0 12/15/2008 0743   ALBUMIN 3.3* 05/13/2015 0910   ALBUMIN 3.8 12/15/2008 0743   AST 26 05/13/2015 0910   AST 30 12/15/2008 0743   ALT 30 05/13/2015 0910   ALT 19 12/15/2008 0743   ALKPHOS 69 05/13/2015 0910   ALKPHOS 68 12/15/2008 0743   BILITOT 0.37 05/13/2015 0910   BILITOT 0.8 12/15/2008 0743   GFRNONAA 88* 12/03/2013 0939   GFRAA >90 12/03/2013  0939    I No results found for: SPEP  Lab Results  Component Value Date   WBC 5.0 05/13/2015   NEUTROABS 3.0 05/13/2015   HGB 13.4 05/13/2015   HCT 40.6 05/13/2015   MCV 91.5 05/13/2015   PLT 191 05/13/2015      Chemistry      Component Value Date/Time   NA 141 05/13/2015 0910   NA 141 12/03/2013 0939   K 4.6 05/13/2015 0910   K 4.5 12/03/2013 0939   CL 103 12/03/2013 0939   CO2 26 05/13/2015 0910   CO2 25 12/03/2013 0939   BUN 17.5 05/13/2015 0910   BUN 12 12/03/2013 0939   CREATININE 0.9 05/13/2015 0910   CREATININE 0.79 12/03/2013 0939      Component Value Date/Time   CALCIUM 9.7 05/13/2015 0910   CALCIUM 9.8 12/03/2013 0939   ALKPHOS 69 05/13/2015 0910   ALKPHOS 68 12/15/2008 0743   AST 26 05/13/2015 0910   AST 30 12/15/2008 0743   ALT 30 05/13/2015 0910   ALT 19 12/15/2008 0743   BILITOT 0.37 05/13/2015 0910   BILITOT 0.8 12/15/2008 0743       No results found for: LABCA2  No components found for: QIONG295  No results for input(s): INR in the last 168 hours.  Urinalysis    Component Value Date/Time   COLORURINE YELLOW 12/15/2008 0701   APPEARANCEUR HAZY* 12/15/2008 0701   LABSPEC 1.031* 12/15/2008 0701   PHURINE 5.5 12/15/2008 0701   GLUCOSEU NEGATIVE 12/15/2008 0701   HGBUR TRACE* 12/15/2008 0701   BILIRUBINUR NEGATIVE 12/15/2008 0701   KETONESUR NEGATIVE 12/15/2008 0701   PROTEINUR NEGATIVE 12/15/2008 0701  UROBILINOGEN 0.2 12/15/2008 0701   NITRITE NEGATIVE 12/15/2008 0701   LEUKOCYTESUR NEGATIVE 12/15/2008 0701    STUDIES: Patient states she had a bone density through Dr. Raul Del office which was normal  ASSESSMENT: 64 y.o. [place] woman s/p left breast biopsy 11/13/2013 for a clinical T2 N0, stage IIA invasive ductal carcinoma, grade 1, estrogen and progesterone receptor positive, HER-2 negative, with an MIB-1 of 89%  (1) status post left lumpectomy and sentinel lymph node sampling 12/05/2013 for a pT2 pN0, stage IIA invasive  ductal carcinoma, grade 2, together with ductal carcinoma in situ, which was also estrogen and progesterone receptor positive margins were involved  (2) status post additional surgery 01/01/2014 for margin clearance, showing no residual tumor  (3) Oncotype score of 15 predicted in a 10 year outside the breast risk of recurrence of 10% if the patient's only systemic treatment was tamoxifen for 5 years. It also predicted no benefit from chemotherapy  (4) adjuvant radiation completed 03/24/2014  (5) tamoxifen started 04/11/2014  (6) the patient was tested for a BRCA2 mutation documented in other family members, c.[id number]>G (IVS5-2A>G). She does not carry this mutation   PLAN: Ahja is doing well as far as her breast cancer is concerned, now 1.5 years out from her definitive surgery with no evidence of recurrent disease. The labs were reviewed in detail and were stable. She is tolerating the tamoxifen well and will continue this for at least 1 more year before giving consideration to an aromatase inhibitor. Her hot flashes are well managed on the clonidine patches. She is switching from desvenlafaxine to venlafaxine is the near future due to costs, and will discuss with Dr. [name] about how best to make that transition.   Zacari will be due for a repeat mammogram in mid April, she will follow up with an office visit shortly after in that same month. She understands and agrees with this plan. She knows the goal of treatment in her case is cure. She has been encouraged to call with any issues that might arise before her next visit here.  Total time spent in appointment was 25 minutes, with greater than 50% of the time spent face to face with the patient.  Laurie Panda, NP   05/20/2015 10:26 AM

## 2015-05-20 NOTE — Telephone Encounter (Signed)
Gave patient avs report and appointments for April 2017.  °

## 2015-05-22 ENCOUNTER — Telehealth: Payer: Self-pay | Admitting: Neurology

## 2015-05-22 DIAGNOSIS — G4733 Obstructive sleep apnea (adult) (pediatric): Secondary | ICD-10-CM

## 2015-05-22 DIAGNOSIS — Z9989 Dependence on other enabling machines and devices: Principal | ICD-10-CM

## 2015-05-22 NOTE — Telephone Encounter (Signed)
Order placed for CPAP supplies, please send to [facility], thx

## 2015-05-22 NOTE — Telephone Encounter (Signed)
Pt called and would like another Rx for [facility] - for cpap supplies. She is wanting to change medical equipment providers and would like [facility] to take over. Please call and advise [phone]

## 2015-05-25 NOTE — Telephone Encounter (Signed)
I will notify Gwinda Passe at [facility].

## 2015-07-10 ENCOUNTER — Telehealth: Payer: Self-pay | Admitting: Oncology

## 2015-07-10 NOTE — Telephone Encounter (Signed)
Patient called in to reschedule her appointment °

## 2015-08-01 ENCOUNTER — Other Ambulatory Visit: Payer: Self-pay | Admitting: Oncology

## 2015-10-07 ENCOUNTER — Encounter: Payer: Self-pay | Admitting: Oncology

## 2015-10-09 ENCOUNTER — Other Ambulatory Visit: Payer: Self-pay | Admitting: Oncology

## 2015-10-09 DIAGNOSIS — Z853 Personal history of malignant neoplasm of breast: Secondary | ICD-10-CM

## 2015-10-14 ENCOUNTER — Ambulatory Visit: Payer: BLUE CROSS/BLUE SHIELD | Attending: General Surgery | Admitting: Physical Therapy

## 2015-10-14 ENCOUNTER — Encounter: Payer: Self-pay | Admitting: Physical Therapy

## 2015-10-14 DIAGNOSIS — I890 Lymphedema, not elsewhere classified: Secondary | ICD-10-CM | POA: Diagnosis present

## 2015-10-14 NOTE — Therapy (Signed)
[place], [place], [address] Phone: [phone]   Fax:  [phone]  Physical Therapy Evaluation  Patient Details  Name: Connie [name] MRN: [id number] Date of Birth: 1951-02-21 Referring Provider: Barry Dienes  Encounter Date: 10/14/2015      PT End of Session - 10/14/15 1435    Visit Number 1   Number of Visits 24   Date for PT Re-Evaluation 12/09/15   PT Start Time 1350   PT Stop Time 1430   PT Time Calculation (min) 40 min   Activity Tolerance Patient tolerated treatment well   Behavior During Therapy [facility] for tasks assessed/performed      Past Medical History  Diagnosis Date  . IBS (irritable bowel syndrome)   . GERD (gastroesophageal reflux disease)   . High cholesterol   . Depression   . PLMD (periodic limb movement disorder) 04/30/2013  . PVC's (premature ventricular contractions) 04/30/2013  . Bronchitis, chronic ([place])   . Headache(784.0)     MIGRAINES  . Meniscus tear     LEFT x 2  . Arthritis     L FOOT  . OSA (obstructive sleep apnea) 04/30/2013    USES C -PAP  . Pneumonia   . Anxiety   . Diverticulitis   . History of shingles Feb. 2015  . Wears glasses   . Cancer [facility])     left breast cancer  . Breast cancer ([place]) 11/13/13    left breast invasive ductal ca,  . Radiation 02/06/14-03/24/14    Left breast ductal cancer    Past Surgical History  Procedure Laterality Date  . Knee surgery Left 2000-2001     RT KNEE X1  . Appendectomy  1984  . Osteotomy      RT KNEE  . Knee arthroscopy Left 08/28/2013    Procedure: LEFT KNEE ARTHROSCOPY WITH MEDIAL AND LATERAL DEBRIDEMENT;  Surgeon: Gearlean Alf, MD;  [facility];  Service: Orthopedics;  Laterality: Left;  . Dilation and curettage of uterus    . Breast lumpectomy Left 12/05/2013    Procedure: BREAST LUMPECTOMY WITH EXCISION OF SENTINEL NODE;  Surgeon: [name], MD;  Location: [place];  Service: General;  Laterality:  Left;  . Re-excision of breast lumpectomy Left 01/01/2014    Procedure: RE-EXCISION OF left BREAST cancer ;  Surgeon: [name], MD;  Location: [place];  Service: General;  Laterality: Left;    There were no vitals filed for this visit.  Visit Diagnosis:  Lymphedema of breast - Plan: PT plan of care cert/re-cert      Subjective Assessment - 10/14/15 1353    Subjective I was coming for lymphedema treatment in 2015 following two lumpectomys and radiation. I had a really bad ceroma. I have never had arm swelling but I wear my sleeve when I travel. I have been wearing the breast compression pad frequently. We have been doing heavy moving which began in March because we are moving in to an RV to begin traveling and since then my left breast and left trunk has been swelling really bad. Pt has been performing self drainage at least once a day but she states she is not doing it regularly.    Pertinent History two left lumpectomys in 2015, sentinel node biopsy - removed 4, stage 2 tumor, developed ceroma following lumpectomy and pt states she can still feel ceroma   Patient Stated Goals get breast smaller   Currently in Pain? Yes  Pain Score 1    Pain Location Breast   Pain Orientation Left   Pain Descriptors / Indicators Heaviness   Pain Type Acute pain   Pain Onset 1 to 4 weeks ago   Pain Frequency Intermittent   Aggravating Factors  as the day goes on   Pain Relieving Factors massage            [facility] PT Assessment - 10/14/15 0001    Assessment   Medical Diagnosis left breast cancer   Referring Provider [place]   Onset Date/Surgical Date 01/13/14  was controlled but began worsening this month    Hand Dominance Left   Prior Therapy lymphedema services 2015   Precautions   Precautions Other (comment)  lymphedema   Restrictions   Weight Bearing Restrictions No   Balance Screen   Has the patient fallen in the past 6 months No   Has the patient had a decrease in  activity level because of a fear of falling?  No   Is the patient reluctant to leave their home because of a fear of falling?  No   Home [place] residence   Living Arrangements Spouse/significant other   Available Help at Discharge Family   Type of Home Other(Comment)  [place] to enter   Entrance Stairs-Number of Steps [place] One level   [place] None   Prior Function   Level of [place] Retired   Leisure pt does not exercise on a regular basis   Cognition   Overall Cognitive Status Within Functional Limits for tasks assessed   Observation/Other Assessments   Other Surveys  --  lymphedema life impact- 25% impairment           LYMPHEDEMA/ONCOLOGY QUESTIONNAIRE - 10/14/15 1415    Right Upper Extremity Lymphedema   15 cm Proximal to Olecranon Process 33.6 cm   Olecranon Process 28 cm   15 cm Proximal to Ulnar Styloid Process 27 cm   Just Proximal to Ulnar Styloid Process 18.3 cm   Across Hand at [employer] 19.3 cm   At [place] of 2nd Digit 6.9 cm   Left Upper Extremity Lymphedema   15 cm Proximal to Olecranon Process 33.1 cm   Olecranon Process 27.7 cm   15 cm Proximal to Ulnar Styloid Process 26.5 cm   Just Proximal to Ulnar Styloid Process 17.5 cm   Across Hand at [employer] 19.2 cm   At [place] of 2nd Digit 6.8 cm                [facility] Adult PT Treatment/Exercise - 10/14/15 0001    Manual Therapy   Manual Therapy Edema management   Manual therapy comments made pt new foam chip bag, cut a pad of large peach dot foam for pt to wear in bra                PT Education - 10/14/15 1434    Education provided Yes   Education Details using peach foam with large dots in bra   Person(s) Educated Patient   Methods Explanation   Comprehension Verbalized understanding           Short Term Clinic Goals - 10/14/15 1440    CC Short  Term Goal  #1   Title Pt will report at least a 25% decrease in left breast edema to decrease  risk of cellulitis   Time 4   Period Weeks   Status New   CC Short Term Goal  #2   Title Pt will report at least a 25 percent improvement in left breast fibrosis to improve comfort   Time 4   Period Weeks   Status New             Long Term Clinic Goals - 10/14/15 1442    CC Long Term Goal  #1   Title Pt will demonstrate correct technique for self manual lymphatic drainage for long term management of edema   Time 8   Period Weeks   Status New   CC Long Term Goal  #2   Title Pt will report at least a 75% improvement in left breast to decrease risk of cellulitis   Time 8   Period Weeks   Status New   CC Long Term Goal  #3   Title Pt will reports at least a 75% improvement in left breast fibrosis   Time 8   Period Weeks   Status New   CC Long Term Goal  #4   Title Pt will score less than 15 percent impairment on the lymphedema life impact scale   Baseline 25 percent   Time 8   Period Weeks   Status New            Plan - 10/14/15 1436    Clinical Impression Statement Pt has been able to control her left breast and lateral trunk edema through use of compression bra and self manual lymphatic drainage. She recently has been moving in an RV to travel and has been lifting a lot. She has noticed a significant increase in left breast edema and there is considerable fibrosis. Pt plans on traveling out [place] soon and would like to get her lymphedema under control before she leaves. Constructed pad for pt out of large dot peach foam for pt to wear in bra to help reduce fibrosis. Also constructed new foam chip bag for pt.    Pt will benefit from skilled therapeutic intervention in order to improve on the following deficits Pain;Increased edema;Increased fascial restricitons   Rehab Potential Excellent   Clinical Impairments Affecting Rehab Potential pt has hx of radiation   PT  Treatment/Interventions Passive range of motion;Therapeutic exercise;Manual techniques;Manual lymph drainage;Taping;Vasopneumatic Device;Scar mobilization;Compression bandaging   PT Next Visit Plan MLD to left breast, ask if large dot peach foam helped edema   Consulted and Agree with Plan of Care Patient         Problem List Patient Active Problem List   Diagnosis Date Noted  . Family history of malignant neoplasm of breast 01/09/2014  . Cancer of lower-inner quadrant of left female breast ([place]) 11/25/2013  . Acute medial meniscal tear 08/27/2013  . OSA (obstructive sleep apnea) 04/30/2013  . PLMD (periodic limb movement disorder) 04/30/2013  . Essential hypertension, benign 04/30/2013  . PVC's (premature ventricular contractions) 04/30/2013    Alexia Freestone 10/14/2015, 5:27 PM  [place], [place], [address] Phone: [phone]   Fax:  [phone]  Name: Connie [name] MRN: [id number] Date of Birth: 09-25-1950   Allyson Sabal, PT 10/14/2015 5:27 PM

## 2015-10-15 ENCOUNTER — Ambulatory Visit: Payer: BLUE CROSS/BLUE SHIELD | Admitting: Physical Therapy

## 2015-10-15 DIAGNOSIS — I890 Lymphedema, not elsewhere classified: Secondary | ICD-10-CM

## 2015-10-15 NOTE — Therapy (Signed)
[place], [place], [address] Phone: [phone]   Fax:  [phone]  Physical Therapy Treatment  Patient Details  Name: Connie [name] MRN: [id number] Date of Birth: 03/04/1951 Referring Provider: Barry Dienes  Encounter Date: 10/15/2015      PT End of Session - 10/15/15 1627    Visit Number 2   Number of Visits 24   Date for PT Re-Evaluation 12/09/15   PT Start Time 1435   PT Stop Time 1515   PT Time Calculation (min) 40 min   Activity Tolerance Patient tolerated treatment well   Behavior During Therapy [facility] for tasks assessed/performed      Past Medical History  Diagnosis Date  . IBS (irritable bowel syndrome)   . GERD (gastroesophageal reflux disease)   . High cholesterol   . Depression   . PLMD (periodic limb movement disorder) 04/30/2013  . PVC's (premature ventricular contractions) 04/30/2013  . Bronchitis, chronic ([place])   . Headache(784.0)     MIGRAINES  . Meniscus tear     LEFT x 2  . Arthritis     L FOOT  . OSA (obstructive sleep apnea) 04/30/2013    USES C -PAP  . Pneumonia   . Anxiety   . Diverticulitis   . History of shingles Feb. 2015  . Wears glasses   . Cancer [facility])     left breast cancer  . Breast cancer ([place]) 11/13/13    left breast invasive ductal ca,  . Radiation 02/06/14-03/24/14    Left breast ductal cancer    Past Surgical History  Procedure Laterality Date  . Knee surgery Left 2000-2001     RT KNEE X1  . Appendectomy  1984  . Osteotomy      RT KNEE  . Knee arthroscopy Left 08/28/2013    Procedure: LEFT KNEE ARTHROSCOPY WITH MEDIAL AND LATERAL DEBRIDEMENT;  Surgeon: Gearlean Alf, MD;  [facility];  Service: Orthopedics;  Laterality: Left;  . Dilation and curettage of uterus    . Breast lumpectomy Left 12/05/2013    Procedure: BREAST LUMPECTOMY WITH EXCISION OF SENTINEL NODE;  Surgeon: [name], MD;  Location: [place];  Service: General;  Laterality:  Left;  . Re-excision of breast lumpectomy Left 01/01/2014    Procedure: RE-EXCISION OF left BREAST cancer ;  Surgeon: [name], MD;  Location: [place];  Service: General;  Laterality: Left;    There were no vitals filed for this visit.  Visit Diagnosis:  Lymphedema of breast      Subjective Assessment - 10/15/15 1619    Subjective Pt reports the foam has helped her.    Pertinent History two left lumpectomys in 2015, sentinel node biopsy - removed 4, stage 2 tumor, developed ceroma following lumpectomy and pt states she can still feel ceroma   Patient Stated Goals get breast smaller   Currently in Pain? No/denies               LYMPHEDEMA/ONCOLOGY QUESTIONNAIRE - 10/14/15 1415    Right Upper Extremity Lymphedema   15 cm Proximal to Olecranon Process 33.6 cm   Olecranon Process 28 cm   15 cm Proximal to Ulnar Styloid Process 27 cm   Just Proximal to Ulnar Styloid Process 18.3 cm   Across Hand at [employer] 19.3 cm   At [place] of 2nd Digit 6.9 cm   Left Upper Extremity Lymphedema   15 cm Proximal to Olecranon Process 33.1 cm  Olecranon Process 27.7 cm   15 cm Proximal to Ulnar Styloid Process 26.5 cm   Just Proximal to Ulnar Styloid Process 17.5 cm   Across Hand at [employer] 19.2 cm   At [place] of 2nd Digit 6.8 cm                  [facility] Adult PT Treatment/Exercise - 10/15/15 0001    Self-Care   Self-Care Other Self-Care Comments   Other Self-Care Comments  showed  jovipack lumpectomy pad and solaris swell spot for pt to consider for added compression to breast    Manual Therapy   Manual Lymphatic Drainage (MLD) In supine, short neck, superficial and deep abdominals, right  axillay nodes and anterior interaxillary anastamosie, left inuinal nodes and left axillo-inguinal anastamosis left abdomen, chest and breast, then to right sidelying for left lateral trunk and breast. especailly around scar    Kinesiotex Edema   Kinesiotix   Edema  skinkote applied, then kinesiotape in I band over scar on breast                PT Education - 10/15/15 1631    Education provided Yes   Education Details commercially made breast compression...copied page from [employer]) Educated Patient   Methods Explanation;Handout   Comprehension Verbalized understanding           Short Term Clinic Goals - 10/14/15 1440    CC Short Term Goal  #1   Title Pt will report at least a 25% decrease in left breast edema to decrease risk of cellulitis   Time 4   Period Weeks   Status New   CC Short Term Goal  #2   Title Pt will report at least a 25 percent improvement in left breast fibrosis to improve comfort   Time 4   Period Weeks   Status New             Long Term Clinic Goals - 10/14/15 1442    CC Long Term Goal  #1   Title Pt will demonstrate correct technique for self manual lymphatic drainage for long term management of edema   Time 8   Period Weeks   Status New   CC Long Term Goal  #2   Title Pt will report at least a 75% improvement in left breast to decrease risk of cellulitis   Time 8   Period Weeks   Status New   CC Long Term Goal  #3   Title Pt will reports at least a 75% improvement in left breast fibrosis   Time 8   Period Weeks   Status New   CC Long Term Goal  #4   Title Pt will score less than 15 percent impairment on the lymphedema life impact scale   Baseline 25 percent   Time 8   Period Weeks   Status New            Plan - 10/15/15 1628    Clinical Impression Statement Pt received some decrease in fibrotic tissue with use of large dotted foam  she will consider other types of commercially made breast compression. tried kinsesiotap to scar today    Pt will benefit from skilled therapeutic intervention in order to improve on the following deficits Pain;Increased edema;Increased fascial restricitons   Clinical Impairments Affecting Rehab Potential pt has hx of radiation   PT  Treatment/Interventions Passive range of motion;Therapeutic exercise;Manual techniques;Manual lymph drainage;Taping;Vasopneumatic Device;Scar mobilization;Compression  bandaging   PT Next Visit Plan MLD to left breast, continue with  large dot peach foam and kiinesiotape if helpful    Consulted and Agree with Plan of Care Patient        Problem List Patient Active Problem List   Diagnosis Date Noted  . Family history of malignant neoplasm of breast 01/09/2014  . Cancer of lower-inner quadrant of left female breast ([place]) 11/25/2013  . Acute medial meniscal tear 08/27/2013  . OSA (obstructive sleep apnea) 04/30/2013  . PLMD (periodic limb movement disorder) 04/30/2013  . Essential hypertension, benign 04/30/2013  . PVC's (premature ventricular contractions) 04/30/2013   Donato Heinz. Owens Shark, PT  10/15/2015, 4:32 PM  [place], [place], [address] Phone: [phone]   Fax:  [phone]  Name: Connie [name] MRN: [id number] Date of Birth: [birth year]

## 2015-10-19 ENCOUNTER — Ambulatory Visit: Payer: BLUE CROSS/BLUE SHIELD

## 2015-10-19 DIAGNOSIS — I890 Lymphedema, not elsewhere classified: Secondary | ICD-10-CM | POA: Diagnosis not present

## 2015-10-19 NOTE — Therapy (Signed)
[place], [place], [address] Phone: [phone]   Fax:  [phone]  Physical Therapy Treatment  Patient Details  Name: Connie [name] MRN: [id number] Date of Birth: July 13, 1951 Referring Provider: Barry Dienes  Encounter Date: 10/19/2015      PT End of Session - 10/19/15 1200    Visit Number 3   Number of Visits 24   Date for PT Re-Evaluation 12/09/15   PT Start Time 1120   PT Stop Time 1159   PT Time Calculation (min) 39 min   Activity Tolerance Patient tolerated treatment well   Behavior During Therapy [facility] for tasks assessed/performed      Past Medical History  Diagnosis Date  . IBS (irritable bowel syndrome)   . GERD (gastroesophageal reflux disease)   . High cholesterol   . Depression   . PLMD (periodic limb movement disorder) 04/30/2013  . PVC's (premature ventricular contractions) 04/30/2013  . Bronchitis, chronic ([place])   . Headache(784.0)     MIGRAINES  . Meniscus tear     LEFT x 2  . Arthritis     L FOOT  . OSA (obstructive sleep apnea) 04/30/2013    USES C -PAP  . Pneumonia   . Anxiety   . Diverticulitis   . History of shingles Feb. 2015  . Wears glasses   . Cancer [facility])     left breast cancer  . Breast cancer ([place]) 11/13/13    left breast invasive ductal ca,  . Radiation 02/06/14-03/24/14    Left breast ductal cancer    Past Surgical History  Procedure Laterality Date  . Knee surgery Left 2000-2001     RT KNEE X1  . Appendectomy  1984  . Osteotomy      RT KNEE  . Knee arthroscopy Left 08/28/2013    Procedure: LEFT KNEE ARTHROSCOPY WITH MEDIAL AND LATERAL DEBRIDEMENT;  Surgeon: Gearlean Alf, MD;  [facility];  Service: Orthopedics;  Laterality: Left;  . Dilation and curettage of uterus    . Breast lumpectomy Left 12/05/2013    Procedure: BREAST LUMPECTOMY WITH EXCISION OF SENTINEL NODE;  Surgeon: [name], MD;  Location: [place];  Service: General;  Laterality:  Left;  . Re-excision of breast lumpectomy Left 01/01/2014    Procedure: RE-EXCISION OF left BREAST cancer ;  Surgeon: [name], MD;  Location: [place];  Service: General;  Laterality: Left;    There were no vitals filed for this visit.  Visit Diagnosis:  Lymphedema of breast      Subjective Assessment - 10/19/15 1122    Subjective The tape made a big difference! I just took it off last night and I could tell how much softer it was. Over the weekend I felt like I did before this flare up. I emailed a picture of the pad Helene Kelp showed me to [place] at [place] [facility] [place] and she will get them for me.    Pertinent History two left lumpectomys in 2015, sentinel node biopsy - removed 4, stage 2 tumor, developed ceroma following lumpectomy and pt states she can still feel ceroma   Patient Stated Goals get breast smaller   Currently in Pain? No/denies                         [facility] Adult PT Treatment/Exercise - 10/19/15 0001    Manual Therapy   Manual Lymphatic Drainage (MLD) In supine, short neck, superficial and  deep abdominals, right  axillay nodes and anterior interaxillary anastamosie, left inuinal nodes and left axillo-inguinal anastamosis left abdomen, chest and breast, then to right sidelying for left lateral trunk and breast. especailly around scar                    Short Term Clinic Goals - 10/14/15 1440    CC Short Term Goal  #1   Title Pt will report at least a 25% decrease in left breast edema to decrease risk of cellulitis   Time 4   Period Weeks   Status New   CC Short Term Goal  #2   Title Pt will report at least a 25 percent improvement in left breast fibrosis to improve comfort   Time 4   Period Weeks   Status New             Long Term Clinic Goals - 10/14/15 1442    CC Long Term Goal  #1   Title Pt will demonstrate correct technique for self manual lymphatic drainage for long term management of edema   Time 8   Period  Weeks   Status New   CC Long Term Goal  #2   Title Pt will report at least a 75% improvement in left breast to decrease risk of cellulitis   Time 8   Period Weeks   Status New   CC Long Term Goal  #3   Title Pt will reports at least a 75% improvement in left breast fibrosis   Time 8   Period Weeks   Status New   CC Long Term Goal  #4   Title Pt will score less than 15 percent impairment on the lymphedema life impact scale   Baseline 25 percent   Time 8   Period Weeks   Status New            Plan - 10/19/15 1203    Clinical Impression Statement Pt had great reported reduction of fluid at her incision when she removed it yesterday, but her skin looked slightly irritated from it being on for a few days so decided not to reapply today and pt agreed with this. Fibrosis palpable at medial breast around incision and notable softening by end of session and pt reported feeling this as well. She has contacted [employer] [place] about a Jovi type pack to wear in her bra and hopes to get that Wednesday.    Pt will benefit from skilled therapeutic intervention in order to improve on the following deficits Pain;Increased edema;Increased fascial restricitons   Rehab Potential Excellent   Clinical Impairments Affecting Rehab Potential pt has hx of radiation   PT Treatment/Interventions Passive range of motion;Therapeutic exercise;Manual techniques;Manual lymph drainage;Taping;Vasopneumatic Device;Scar mobilization;Compression bandaging   PT Next Visit Plan Cont manual lymph drainage to Lt breast. Reapply kinesiotape if redness and irritation has decreased.    Consulted and Agree with Plan of Care Patient        Problem List Patient Active Problem List   Diagnosis Date Noted  . Family history of malignant neoplasm of breast 01/09/2014  . Cancer of lower-inner quadrant of left female breast ([place]) 11/25/2013  . Acute medial meniscal tear 08/27/2013  . OSA (obstructive sleep apnea) 04/30/2013   . PLMD (periodic limb movement disorder) 04/30/2013  . Essential hypertension, benign 04/30/2013  . PVC's (premature ventricular contractions) 04/30/2013    Otelia Limes, PTA 10/19/2015, 12:07 PM  [place]  [place], [place], [address] Phone: [phone]   Fax:  [phone]  Name: Connie [name] MRN: [id number] Date of Birth: 01-20-1951

## 2015-10-21 ENCOUNTER — Ambulatory Visit: Payer: BLUE CROSS/BLUE SHIELD

## 2015-10-21 DIAGNOSIS — I890 Lymphedema, not elsewhere classified: Secondary | ICD-10-CM | POA: Diagnosis not present

## 2015-10-21 NOTE — Therapy (Signed)
[place], [place], [address] Phone: [phone]   Fax:  [phone]  Physical Therapy Treatment  Patient Details  Name: Connie [name] MRN: [id number] Date of Birth: Dec 10, 1950 Referring Provider: Barry Dienes  Encounter Date: 10/21/2015      PT End of Session - 10/21/15 1156    Visit Number 4   Number of Visits 24   Date for PT Re-Evaluation 12/09/15   PT Start Time 1108   PT Stop Time 1154   PT Time Calculation (min) 46 min   Activity Tolerance Patient tolerated treatment well   Behavior During Therapy [facility] for tasks assessed/performed      Past Medical History  Diagnosis Date  . IBS (irritable bowel syndrome)   . GERD (gastroesophageal reflux disease)   . High cholesterol   . Depression   . PLMD (periodic limb movement disorder) 04/30/2013  . PVC's (premature ventricular contractions) 04/30/2013  . Bronchitis, chronic ([place])   . Headache(784.0)     MIGRAINES  . Meniscus tear     LEFT x 2  . Arthritis     L FOOT  . OSA (obstructive sleep apnea) 04/30/2013    USES C -PAP  . Pneumonia   . Anxiety   . Diverticulitis   . History of shingles Feb. 2015  . Wears glasses   . Cancer [facility])     left breast cancer  . Breast cancer ([place]) 11/13/13    left breast invasive ductal ca,  . Radiation 02/06/14-03/24/14    Left breast ductal cancer    Past Surgical History  Procedure Laterality Date  . Knee surgery Left 2000-2001     RT KNEE X1  . Appendectomy  1984  . Osteotomy      RT KNEE  . Knee arthroscopy Left 08/28/2013    Procedure: LEFT KNEE ARTHROSCOPY WITH MEDIAL AND LATERAL DEBRIDEMENT;  Surgeon: Gearlean Alf, MD;  [facility];  Service: Orthopedics;  Laterality: Left;  . Dilation and curettage of uterus    . Breast lumpectomy Left 12/05/2013    Procedure: BREAST LUMPECTOMY WITH EXCISION OF SENTINEL NODE;  Surgeon: [name], MD;  Location: [place];  Service: General;  Laterality:  Left;  . Re-excision of breast lumpectomy Left 01/01/2014    Procedure: RE-EXCISION OF left BREAST cancer ;  Surgeon: [name], MD;  Location: [place];  Service: General;  Laterality: Left;    There were no vitals filed for this visit.  Visit Diagnosis:  Lymphedema of breast      Subjective Assessment - 10/21/15 1111    Subjective I sat out in the sun yesterday and it felt so nice but did an extra long massage last night and the fluid feels great today! I'm going to go get my jovi packs today after therapy at [facility] (probably going to get 2).   Pertinent History two left lumpectomys in 2015, sentinel node biopsy - removed 4, stage 2 tumor, developed ceroma following lumpectomy and pt states she can still feel ceroma   Patient Stated Goals get breast smaller   Currently in Pain? No/denies                         [facility] Adult PT Treatment/Exercise - 10/21/15 0001    Manual Therapy   Manual Lymphatic Drainage (MLD) In supine, short neck, superficial and deep abdominals, right  axillay nodes and anterior interaxillary anastamosie, left inuinal  nodes and left axillo-inguinal anastamosis left abdomen, chest and breast, then to right sidelying for left lateral trunk and breast. especailly around scar    Kinesiotix   Edema skinkote applied, then kinesiotape in I band over scar on breast                   Short Term Clinic Goals - 10/21/15 1202    CC Short Term Goal  #1   Title Pt will report at least a 25% decrease in left breast edema to decrease risk of cellulitis   Status Achieved   CC Short Term Goal  #2   Title Pt will report at least a 25 percent improvement in left breast fibrosis to improve comfort   Status Achieved             Long Term Clinic Goals - 10/14/15 1442    CC Long Term Goal  #1   Title Pt will demonstrate correct technique for self manual lymphatic drainage for long term management of edema   Time 8    Period Weeks   Status New   CC Long Term Goal  #2   Title Pt will report at least a 75% improvement in left breast to decrease risk of cellulitis   Time 8   Period Weeks   Status New   CC Long Term Goal  #3   Title Pt will reports at least a 75% improvement in left breast fibrosis   Time 8   Period Weeks   Status New   CC Long Term Goal  #4   Title Pt will score less than 15 percent impairment on the lymphedema life impact scale   Baseline 25 percent   Time 8   Period Weeks   Status New            Plan - 10/21/15 1157    Clinical Impression Statement Pts skin looked great where tape had been so reapplied today at incision as done before. She reports noticing a good improvement this week with the reduction of her fluid especially reporting tape and being more compliant with her manual lymph drainage has helped. She is going to [facility] to get Jovi pack(s) to wear in her bra so this should also help her to continue to see benefit/reduction of fluid.   Pt will benefit from skilled therapeutic intervention in order to improve on the following deficits Pain;Increased edema;Increased fascial restricitons   Rehab Potential Excellent   Clinical Impairments Affecting Rehab Potential pt has hx of radiation   PT Treatment/Interventions Passive range of motion;Therapeutic exercise;Manual techniques;Manual lymph drainage;Taping;Vasopneumatic Device;Scar mobilization;Compression bandaging   PT Next Visit Plan Cont manual lymph drainage to Lt breast. Reapply kinesiotape if skin looks okay/instruct pt in same.    Consulted and Agree with Plan of Care Patient        Problem List Patient Active Problem List   Diagnosis Date Noted  . Family history of malignant neoplasm of breast 01/09/2014  . Cancer of lower-inner quadrant of left female breast ([place]) 11/25/2013  . Acute medial meniscal tear 08/27/2013  . OSA (obstructive sleep apnea) 04/30/2013  . PLMD (periodic limb movement  disorder) 04/30/2013  . Essential hypertension, benign 04/30/2013  . PVC's (premature ventricular contractions) 04/30/2013    Otelia Limes, PTA 10/21/2015, 12:02 PM  [place], [place], [address] Phone: [phone]   Fax:  [phone]  Name: CLIFTON GRULLON MRN: [id number]  Date of Birth: [birth year]

## 2015-10-23 ENCOUNTER — Ambulatory Visit: Payer: BLUE CROSS/BLUE SHIELD | Admitting: Physical Therapy

## 2015-10-23 ENCOUNTER — Encounter: Payer: Self-pay | Admitting: Physical Therapy

## 2015-10-23 DIAGNOSIS — I890 Lymphedema, not elsewhere classified: Secondary | ICD-10-CM

## 2015-10-23 NOTE — Therapy (Signed)
[place], [place], [address] Phone: [phone]   Fax:  [phone]  Physical Therapy Treatment  Patient Details  Name: Connie [name] MRN: [id number] Date of Birth: July 30, 1951 Referring Provider: Barry Dienes  Encounter Date: 10/23/2015      PT End of Session - 10/23/15 1149    Visit Number 5   Number of Visits 24   Date for PT Re-Evaluation 12/09/15   PT Start Time 1058   PT Stop Time 1143   PT Time Calculation (min) 45 min   Activity Tolerance Patient tolerated treatment well   Behavior During Therapy [facility] for tasks assessed/performed      Past Medical History  Diagnosis Date  . IBS (irritable bowel syndrome)   . GERD (gastroesophageal reflux disease)   . High cholesterol   . Depression   . PLMD (periodic limb movement disorder) 04/30/2013  . PVC's (premature ventricular contractions) 04/30/2013  . Bronchitis, chronic ([place])   . Headache(784.0)     MIGRAINES  . Meniscus tear     LEFT x 2  . Arthritis     L FOOT  . OSA (obstructive sleep apnea) 04/30/2013    USES C -PAP  . Pneumonia   . Anxiety   . Diverticulitis   . History of shingles Feb. 2015  . Wears glasses   . Cancer [facility])     left breast cancer  . Breast cancer ([place]) 11/13/13    left breast invasive ductal ca,  . Radiation 02/06/14-03/24/14    Left breast ductal cancer    Past Surgical History  Procedure Laterality Date  . Knee surgery Left 2000-2001     RT KNEE X1  . Appendectomy  1984  . Osteotomy      RT KNEE  . Knee arthroscopy Left 08/28/2013    Procedure: LEFT KNEE ARTHROSCOPY WITH MEDIAL AND LATERAL DEBRIDEMENT;  Surgeon: Gearlean Alf, MD;  [facility];  Service: Orthopedics;  Laterality: Left;  . Dilation and curettage of uterus    . Breast lumpectomy Left 12/05/2013    Procedure: BREAST LUMPECTOMY WITH EXCISION OF SENTINEL NODE;  Surgeon: [name], MD;  Location: [place];  Service: General;  Laterality:  Left;  . Re-excision of breast lumpectomy Left 01/01/2014    Procedure: RE-EXCISION OF left BREAST cancer ;  Surgeon: [name], MD;  Location: [place];  Service: General;  Laterality: Left;    There were no vitals filed for this visit.  Visit Diagnosis:  Lymphedema of breast      Subjective Assessment - 10/23/15 1100    Subjective (p) Pt has not gone to pick up Jovi Paks yet. My swelling has been doing much better. I do self massage at night time especially before I go to bed. The tape seems to help.    Pertinent History (p) two left lumpectomys in 2015, sentinel node biopsy - removed 4, stage 2 tumor, developed ceroma following lumpectomy and pt states she can still feel ceroma   Patient Stated Goals (p) get breast smaller   Currently in Pain? (p) No/denies   Pain Score (p) 0-No pain   Pain Descriptors / Indicators (p) Discomfort                         [facility] Adult PT Treatment/Exercise - 10/23/15 0001    Manual Therapy   Manual Lymphatic Drainage (MLD) In supine, short neck, superficial and deep abdominals, right  axillay nodes and anterior interaxillary anastamosie, left inuinal nodes and left axillo-inguinal anastamosis left abdomen, chest and breast, then to right sidelying for left lateral trunk and breast. especailly around scar                    Short Term Clinic Goals - 10/21/15 1202    CC Short Term Goal  #1   Title Pt will report at least a 25% decrease in left breast edema to decrease risk of cellulitis   Status Achieved   CC Short Term Goal  #2   Title Pt will report at least a 25 percent improvement in left breast fibrosis to improve comfort   Status Achieved             Long Term Clinic Goals - 10/14/15 1442    CC Long Term Goal  #1   Title Pt will demonstrate correct technique for self manual lymphatic drainage for long term management of edema   Time 8   Period Weeks   Status New   CC Long Term Goal  #2    Title Pt will report at least a 75% improvement in left breast to decrease risk of cellulitis   Time 8   Period Weeks   Status New   CC Long Term Goal  #3   Title Pt will reports at least a 75% improvement in left breast fibrosis   Time 8   Period Weeks   Status New   CC Long Term Goal  #4   Title Pt will score less than 15 percent impairment on the lymphedema life impact scale   Baseline 25 percent   Time 8   Period Weeks   Status New            Plan - 10/23/15 1151    Clinical Impression Statement Pt had some redness and a small raised area where tape was removed. Area was very small but tape was not reapplied. Pt continues to notice improvement in left breast swelling. The peach foam is helping decrease her fibrosis but pt did have increased fibrosis in medial/inferior portion of left breast and pt was educated to move peach foam to this area to help decrease fibrosis.    Pt will benefit from skilled therapeutic intervention in order to improve on the following deficits Pain;Increased edema;Increased fascial restricitons   Rehab Potential Excellent   Clinical Impairments Affecting Rehab Potential pt has hx of radiation   PT Treatment/Interventions Passive range of motion;Therapeutic exercise;Manual techniques;Manual lymph drainage;Taping;Vasopneumatic Device;Scar mobilization;Compression bandaging   PT Next Visit Plan Cont manual lymph drainage to Lt breast. Reapply kinesiotape if skin looks okay/instruct pt in same.    Consulted and Agree with Plan of Care Patient        Problem List Patient Active Problem List   Diagnosis Date Noted  . Family history of malignant neoplasm of breast 01/09/2014  . Cancer of lower-inner quadrant of left female breast ([place]) 11/25/2013  . Acute medial meniscal tear 08/27/2013  . OSA (obstructive sleep apnea) 04/30/2013  . PLMD (periodic limb movement disorder) 04/30/2013  . Essential hypertension, benign 04/30/2013  . PVC's (premature  ventricular contractions) 04/30/2013    Alexia Freestone 10/23/2015, 11:54 AM  [place], [place], [address] Phone: [phone]   Fax:  [phone]  Name: Connie [name] MRN: [id number] Date of Birth: 1951/05/17    Allyson Sabal, PT 10/23/2015 11:54 AM

## 2015-10-26 ENCOUNTER — Ambulatory Visit: Payer: BLUE CROSS/BLUE SHIELD | Admitting: Physical Therapy

## 2015-10-26 ENCOUNTER — Encounter: Payer: Self-pay | Admitting: Physical Therapy

## 2015-10-26 DIAGNOSIS — I890 Lymphedema, not elsewhere classified: Secondary | ICD-10-CM

## 2015-10-26 NOTE — Therapy (Signed)
[place], [place], [address] Phone: [phone]   Fax:  [phone]  Physical Therapy Treatment  Patient Details  Name: Connie [name] MRN: [id number] Date of Birth: August 25, 1950 Referring Provider: Barry Dienes  Encounter Date: 10/26/2015      PT End of Session - 10/26/15 1513    Visit Number 6   Number of Visits 24   Date for PT Re-Evaluation 12/09/15   PT Start Time [id number]   PT Stop Time 1515   PT Time Calculation (min) 41 min   Activity Tolerance Patient tolerated treatment well   Behavior During Therapy [facility] for tasks assessed/performed      Past Medical History  Diagnosis Date  . IBS (irritable bowel syndrome)   . GERD (gastroesophageal reflux disease)   . High cholesterol   . Depression   . PLMD (periodic limb movement disorder) 04/30/2013  . PVC's (premature ventricular contractions) 04/30/2013  . Bronchitis, chronic ([place])   . Headache(784.0)     MIGRAINES  . Meniscus tear     LEFT x 2  . Arthritis     L FOOT  . OSA (obstructive sleep apnea) 04/30/2013    USES C -PAP  . Pneumonia   . Anxiety   . Diverticulitis   . History of shingles Feb. 2015  . Wears glasses   . Cancer [facility])     left breast cancer  . Breast cancer ([place]) 11/13/13    left breast invasive ductal ca,  . Radiation 02/06/14-03/24/14    Left breast ductal cancer    Past Surgical History  Procedure Laterality Date  . Knee surgery Left 2000-2001     RT KNEE X1  . Appendectomy  1984  . Osteotomy      RT KNEE  . Knee arthroscopy Left 08/28/2013    Procedure: LEFT KNEE ARTHROSCOPY WITH MEDIAL AND LATERAL DEBRIDEMENT;  Surgeon: Gearlean Alf, MD;  [facility];  Service: Orthopedics;  Laterality: Left;  . Dilation and curettage of uterus    . Breast lumpectomy Left 12/05/2013    Procedure: BREAST LUMPECTOMY WITH EXCISION OF SENTINEL NODE;  Surgeon: [name], MD;  Location: [place];  Service: General;  Laterality:  Left;  . Re-excision of breast lumpectomy Left 01/01/2014    Procedure: RE-EXCISION OF left BREAST cancer ;  Surgeon: [name], MD;  Location: [place];  Service: General;  Laterality: Left;    There were no vitals filed for this visit.  Visit Diagnosis:  Lymphedema of breast      Subjective Assessment - 10/26/15 1438    Subjective Pt plans on picking up Jovi Paks this week. Pt states she just felt her breast swell up in the last hour.    Pertinent History two left lumpectomys in 2015, sentinel node biopsy - removed 4, stage 2 tumor, developed ceroma following lumpectomy and pt states she can still feel ceroma   Patient Stated Goals get breast smaller   Currently in Pain? No/denies   Pain Score 0-No pain                         [facility] Adult PT Treatment/Exercise - 10/26/15 0001    Manual Therapy   Manual Lymphatic Drainage (MLD) In supine, short neck, superficial and deep abdominals, right  axillay nodes and anterior interaxillary anastamosie, left inuinal nodes and left axillo-inguinal anastamosis left abdomen, chest and breast, then to right sidelying for left lateral  trunk and breast. especailly around scar    Kinesiotix   Edema skinkote applied, then kinesiotape in I band over scar on breast                   Short Term Clinic Goals - 10/21/15 1202    CC Short Term Goal  #1   Title Pt will report at least a 25% decrease in left breast edema to decrease risk of cellulitis   Status Achieved   CC Short Term Goal  #2   Title Pt will report at least a 25 percent improvement in left breast fibrosis to improve comfort   Status Achieved             Long Term Clinic Goals - 10/14/15 1442    CC Long Term Goal  #1   Title Pt will demonstrate correct technique for self manual lymphatic drainage for long term management of edema   Time 8   Period Weeks   Status New   CC Long Term Goal  #2   Title Pt will report at least a 75%  improvement in left breast to decrease risk of cellulitis   Time 8   Period Weeks   Status New   CC Long Term Goal  #3   Title Pt will reports at least a 75% improvement in left breast fibrosis   Time 8   Period Weeks   Status New   CC Long Term Goal  #4   Title Pt will score less than 15 percent impairment on the lymphedema life impact scale   Baseline 25 percent   Time 8   Period Weeks   Status New            Plan - 10/26/15 1513    Clinical Impression Statement Kinesiotape reapplied to left medial breast. Pt had indentations in left breast from peach foam and she feels that it is really helping with the fibrosis. Following MLD pt felt improvement in left breast edema.    Pt will benefit from skilled therapeutic intervention in order to improve on the following deficits Pain;Increased edema;Increased fascial restricitons   Rehab Potential Excellent   Clinical Impairments Affecting Rehab Potential pt has hx of radiation   PT Treatment/Interventions Passive range of motion;Therapeutic exercise;Manual techniques;Manual lymph drainage;Taping;Vasopneumatic Device;Scar mobilization;Compression bandaging   PT Next Visit Plan continued MLD to left breast, reapply kinesiotape if skin is ok   Consulted and Agree with Plan of Care Patient        Problem List Patient Active Problem List   Diagnosis Date Noted  . Family history of malignant neoplasm of breast 01/09/2014  . Cancer of lower-inner quadrant of left female breast ([place]) 11/25/2013  . Acute medial meniscal tear 08/27/2013  . OSA (obstructive sleep apnea) 04/30/2013  . PLMD (periodic limb movement disorder) 04/30/2013  . Essential hypertension, benign 04/30/2013  . PVC's (premature ventricular contractions) 04/30/2013    Alexia Freestone 10/26/2015, 3:16 PM  [place], [place], [address] Phone: [phone]   Fax:  [phone]  Name:  Connie [name] MRN: [id number] Date of Birth: 1951-03-18   Allyson Sabal, PT 10/26/2015 3:17 PM

## 2015-10-28 ENCOUNTER — Ambulatory Visit: Payer: BLUE CROSS/BLUE SHIELD | Admitting: Physical Therapy

## 2015-10-28 ENCOUNTER — Encounter: Payer: Self-pay | Admitting: Physical Therapy

## 2015-10-28 DIAGNOSIS — I890 Lymphedema, not elsewhere classified: Secondary | ICD-10-CM | POA: Diagnosis not present

## 2015-10-28 NOTE — Therapy (Signed)
[place], [place], [address] Phone: [phone]   Fax:  [phone]  Physical Therapy Treatment  Patient Details  Name: Connie [name] MRN: [id number] Date of Birth: Jul 29, 1951 Referring Provider: Barry Dienes  Encounter Date: 10/28/2015      PT End of Session - 10/28/15 1157    Visit Number 7   Number of Visits 24   Date for PT Re-Evaluation 12/09/15   PT Start Time 1115   PT Stop Time 1157   PT Time Calculation (min) 42 min   Activity Tolerance Patient tolerated treatment well   Behavior During Therapy [facility] for tasks assessed/performed      Past Medical History  Diagnosis Date  . IBS (irritable bowel syndrome)   . GERD (gastroesophageal reflux disease)   . High cholesterol   . Depression   . PLMD (periodic limb movement disorder) 04/30/2013  . PVC's (premature ventricular contractions) 04/30/2013  . Bronchitis, chronic ([place])   . Headache(784.0)     MIGRAINES  . Meniscus tear     LEFT x 2  . Arthritis     L FOOT  . OSA (obstructive sleep apnea) 04/30/2013    USES C -PAP  . Pneumonia   . Anxiety   . Diverticulitis   . History of shingles Feb. 2015  . Wears glasses   . Cancer [facility])     left breast cancer  . Breast cancer ([place]) 11/13/13    left breast invasive ductal ca,  . Radiation 02/06/14-03/24/14    Left breast ductal cancer    Past Surgical History  Procedure Laterality Date  . Knee surgery Left 2000-2001     RT KNEE X1  . Appendectomy  1984  . Osteotomy      RT KNEE  . Knee arthroscopy Left 08/28/2013    Procedure: LEFT KNEE ARTHROSCOPY WITH MEDIAL AND LATERAL DEBRIDEMENT;  Surgeon: Gearlean Alf, MD;  [facility];  Service: Orthopedics;  Laterality: Left;  . Dilation and curettage of uterus    . Breast lumpectomy Left 12/05/2013    Procedure: BREAST LUMPECTOMY WITH EXCISION OF SENTINEL NODE;  Surgeon: [name], MD;  Location: [place];  Service: General;  Laterality:  Left;  . Re-excision of breast lumpectomy Left 01/01/2014    Procedure: RE-EXCISION OF left BREAST cancer ;  Surgeon: [name], MD;  Location: [place];  Service: General;  Laterality: Left;    There were no vitals filed for this visit.  Visit Diagnosis:  Lymphedema of breast      Subjective Assessment - 10/28/15 1118    Subjective My swelling has gone down some since it was so swollen last time. I still need to pick up my Jovi Paks.    Pertinent History two left lumpectomys in 2015, sentinel node biopsy - removed 4, stage 2 tumor, developed ceroma following lumpectomy and pt states she can still feel ceroma   Patient Stated Goals get breast smaller   Currently in Pain? No/denies   Pain Score 0-No pain                         [facility] Adult PT Treatment/Exercise - 10/28/15 0001    Manual Therapy   Manual Lymphatic Drainage (MLD) In supine, short neck, superficial and deep abdominals, right  axillay nodes and anterior interaxillary anastamosie, left inuinal nodes and left axillo-inguinal anastamosis left abdomen, chest and breast, then to right sidelying for left lateral  trunk and breast. especailly around scar    Kinesiotix   Edema kinesiotape in I band over scar on breast                PT Education - 10/28/15 1156    Education provided Yes   Education Details how to apply kinesiotape to breast   Person(s) Educated Patient   Methods Explanation   Comprehension Verbalized understanding           Short Term Clinic Goals - 10/21/15 1202    CC Short Term Goal  #1   Title Pt will report at least a 25% decrease in left breast edema to decrease risk of cellulitis   Status Achieved   CC Short Term Goal  #2   Title Pt will report at least a 25 percent improvement in left breast fibrosis to improve comfort   Status Achieved             Long Term Clinic Goals - 10/14/15 1442    CC Long Term Goal  #1   Title Pt will demonstrate  correct technique for self manual lymphatic drainage for long term management of edema   Time 8   Period Weeks   Status New   CC Long Term Goal  #2   Title Pt will report at least a 75% improvement in left breast to decrease risk of cellulitis   Time 8   Period Weeks   Status New   CC Long Term Goal  #3   Title Pt will reports at least a 75% improvement in left breast fibrosis   Time 8   Period Weeks   Status New   CC Long Term Goal  #4   Title Pt will score less than 15 percent impairment on the lymphedema life impact scale   Baseline 25 percent   Time 8   Period Weeks   Status New            Plan - 10/28/15 1158    Clinical Impression Statement Pt demonstrates improvement of fibrotic area in medial breast where kinesiotape was applied. She had some fibrosis just medial to nipple so kinesiotape was reapplied to this area. Pt's edema is improving.    Pt will benefit from skilled therapeutic intervention in order to improve on the following deficits Pain;Increased edema;Increased fascial restricitons   Rehab Potential Excellent   Clinical Impairments Affecting Rehab Potential pt has hx of radiation   PT Treatment/Interventions Passive range of motion;Therapeutic exercise;Manual techniques;Manual lymph drainage;Taping;Vasopneumatic Device;Scar mobilization;Compression bandaging   PT Next Visit Plan continued MLD to left breast, reapply kinesiotape if skin is ok   Consulted and Agree with Plan of Care Patient        Problem List Patient Active Problem List   Diagnosis Date Noted  . Family history of malignant neoplasm of breast 01/09/2014  . Cancer of lower-inner quadrant of left female breast ([place]) 11/25/2013  . Acute medial meniscal tear 08/27/2013  . OSA (obstructive sleep apnea) 04/30/2013  . PLMD (periodic limb movement disorder) 04/30/2013  . Essential hypertension, benign 04/30/2013  . PVC's (premature ventricular contractions) 04/30/2013    Connie [name] 10/28/2015, 12:01 PM  [place], [place], [address] Phone: [phone]   Fax:  [phone]  Name: BRANDALYN ANASTACIO MRN: [id number] Date of Birth: 1950-09-20    Allyson Sabal, PT 10/28/2015 12:06 PM

## 2015-10-30 ENCOUNTER — Encounter: Payer: Self-pay | Admitting: Physical Therapy

## 2015-10-30 ENCOUNTER — Ambulatory Visit: Payer: BLUE CROSS/BLUE SHIELD | Admitting: Physical Therapy

## 2015-10-30 DIAGNOSIS — I890 Lymphedema, not elsewhere classified: Secondary | ICD-10-CM | POA: Diagnosis not present

## 2015-10-30 NOTE — Therapy (Signed)
[place], [place], [address] Phone: [phone]   Fax:  [phone]  Physical Therapy Treatment  Patient Details  Name: Connie [name] MRN: [id number] Date of Birth: [birth year] Referring Provider: Barry Dienes  Encounter Date: 10/30/2015      PT End of Session - 10/30/15 1104    Visit Number 8   Number of Visits 24   Date for PT Re-Evaluation 12/09/15   PT Start Time 1018   PT Stop Time 1103   PT Time Calculation (min) 45 min   Activity Tolerance Patient tolerated treatment well   Behavior During Therapy [facility] for tasks assessed/performed      Past Medical History  Diagnosis Date  . IBS (irritable bowel syndrome)   . GERD (gastroesophageal reflux disease)   . High cholesterol   . Depression   . PLMD (periodic limb movement disorder) 04/30/2013  . PVC's (premature ventricular contractions) 04/30/2013  . Bronchitis, chronic ([place])   . Headache(784.0)     MIGRAINES  . Meniscus tear     LEFT x 2  . Arthritis     L FOOT  . OSA (obstructive sleep apnea) 04/30/2013    USES C -PAP  . Pneumonia   . Anxiety   . Diverticulitis   . History of shingles Feb. 2015  . Wears glasses   . Cancer [facility])     left breast cancer  . Breast cancer ([place]) 11/13/13    left breast invasive ductal ca,  . Radiation 02/06/14-03/24/14    Left breast ductal cancer    Past Surgical History  Procedure Laterality Date  . Knee surgery Left 2000-2001     RT KNEE X1  . Appendectomy  1984  . Osteotomy      RT KNEE  . Knee arthroscopy Left 08/28/2013    Procedure: LEFT KNEE ARTHROSCOPY WITH MEDIAL AND LATERAL DEBRIDEMENT;  Surgeon: Gearlean Alf, MD;  [facility];  Service: Orthopedics;  Laterality: Left;  . Dilation and curettage of uterus    . Breast lumpectomy Left 12/05/2013    Procedure: BREAST LUMPECTOMY WITH EXCISION OF SENTINEL NODE;  Surgeon: [name], MD;  Location: [place];  Service: General;  Laterality:  Left;  . Re-excision of breast lumpectomy Left 01/01/2014    Procedure: RE-EXCISION OF left BREAST cancer ;  Surgeon: [name], MD;  Location: [place];  Service: General;  Laterality: Left;    There were no vitals filed for this visit.  Visit Diagnosis:  Lymphedema of breast      Subjective Assessment - 10/30/15 1022    Subjective My swelling is doing pretty good. I didn't wear any of the pads yesterday. I am hoping to pick up my Jovi Paks today.    Pertinent History two left lumpectomys in 2015, sentinel node biopsy - removed 4, stage 2 tumor, developed ceroma following lumpectomy and pt states she can still feel ceroma   Patient Stated Goals get breast smaller   Currently in Pain? No/denies   Pain Score 0-No pain                         [facility] Adult PT Treatment/Exercise - 10/30/15 0001    Manual Therapy   Manual Lymphatic Drainage (MLD) In supine, short neck, superficial and deep abdominals, right  axillay nodes and anterior interaxillary anastamosie, left inuinal nodes and left axillo-inguinal anastamosis left abdomen, chest and breast, then to right sidelying for  left lateral trunk and breast. especailly around scar    Kinesiotix   Edema kinesiotape in I band over scar on breast                   Short Term Clinic Goals - 10/21/15 1202    CC Short Term Goal  #1   Title Pt will report at least a 25% decrease in left breast edema to decrease risk of cellulitis   Status Achieved   CC Short Term Goal  #2   Title Pt will report at least a 25 percent improvement in left breast fibrosis to improve comfort   Status Achieved             Long Term Clinic Goals - 10/30/15 1100    CC Long Term Goal  #1   Title Pt will demonstrate correct technique for self manual lymphatic drainage for long term management of edema   Status On-going   CC Long Term Goal  #2   Title Pt will report at least a 75% improvement in left breast to  decrease risk of cellulitis   Baseline Pt reports an 85% improvement   Status Achieved   CC Long Term Goal  #3   Title Pt will reports at least a 75% improvement in left breast fibrosis   Baseline 10/30/15- Pt states she has had a 50% improvement in fibrosis   Status On-going   CC Long Term Goal  #4   Title Pt will score less than 15 percent impairment on the lymphedema life impact scale   Baseline 25 percent   Status On-going            Plan - 10/30/15 1102    Clinical Impression Statement Pt continues to demonstrate improvement in edema and fibrosis of left breast. She will be ready for discharge at the end of next week. The kinesiotape continues to help decrease fibrotic area medial to nipple. Pt plans on bringing her husband in to educate him about drainage technique.    Pt will benefit from skilled therapeutic intervention in order to improve on the following deficits Pain;Increased edema;Increased fascial restricitons   Rehab Potential Excellent   Clinical Impairments Affecting Rehab Potential pt has hx of radiation   PT Treatment/Interventions Passive range of motion;Therapeutic exercise;Manual techniques;Manual lymph drainage;Taping;Vasopneumatic Device;Scar mobilization;Compression bandaging   PT Next Visit Plan give LLIS again, assess self drainage technique, continue MLD to left breast   Consulted and Agree with Plan of Care Patient        Problem List Patient Active Problem List   Diagnosis Date Noted  . Family history of malignant neoplasm of breast 01/09/2014  . Cancer of lower-inner quadrant of left female breast ([place]) 11/25/2013  . Acute medial meniscal tear 08/27/2013  . OSA (obstructive sleep apnea) 04/30/2013  . PLMD (periodic limb movement disorder) 04/30/2013  . Essential hypertension, benign 04/30/2013  . PVC's (premature ventricular contractions) 04/30/2013    Alexia Freestone 10/30/2015, 11:05 AM  [place], [place], [address] Phone: [phone]   Fax:  [phone]  Name: Connie [name] MRN: [id number] Date of Birth: 12/24/1950    Allyson Sabal, PT 10/30/2015 11:05 AM

## 2015-11-02 ENCOUNTER — Encounter: Payer: Self-pay | Admitting: Physical Therapy

## 2015-11-02 ENCOUNTER — Ambulatory Visit: Payer: BLUE CROSS/BLUE SHIELD | Attending: General Surgery | Admitting: Physical Therapy

## 2015-11-02 DIAGNOSIS — I890 Lymphedema, not elsewhere classified: Secondary | ICD-10-CM | POA: Insufficient documentation

## 2015-11-02 NOTE — Therapy (Signed)
[place], [place], [address] Phone: [phone]   Fax:  [phone]  Physical Therapy Treatment  Patient Details  Name: Connie [name] MRN: [id number] Date of Birth: 04/15/1951 Referring Provider: Barry Dienes  Encounter Date: 11/02/2015      PT End of Session - 11/02/15 1343    Visit Number 9   Number of Visits 24   Date for PT Re-Evaluation 12/09/15   PT Start Time 0100   PT Stop Time 0143   PT Time Calculation (min) 43 min   Activity Tolerance Patient tolerated treatment well   Behavior During Therapy [facility] for tasks assessed/performed      Past Medical History  Diagnosis Date  . IBS (irritable bowel syndrome)   . GERD (gastroesophageal reflux disease)   . High cholesterol   . Depression   . PLMD (periodic limb movement disorder) 04/30/2013  . PVC's (premature ventricular contractions) 04/30/2013  . Bronchitis, chronic ([place])   . Headache(784.0)     MIGRAINES  . Meniscus tear     LEFT x 2  . Arthritis     L FOOT  . OSA (obstructive sleep apnea) 04/30/2013    USES C -PAP  . Pneumonia   . Anxiety   . Diverticulitis   . History of shingles Feb. 2015  . Wears glasses   . Cancer [facility])     left breast cancer  . Breast cancer ([place]) 11/13/13    left breast invasive ductal ca,  . Radiation 02/06/14-03/24/14    Left breast ductal cancer    Past Surgical History  Procedure Laterality Date  . Knee surgery Left 2000-2001     RT KNEE X1  . Appendectomy  1984  . Osteotomy      RT KNEE  . Knee arthroscopy Left 08/28/2013    Procedure: LEFT KNEE ARTHROSCOPY WITH MEDIAL AND LATERAL DEBRIDEMENT;  Surgeon: Gearlean Alf, MD;  [facility];  Service: Orthopedics;  Laterality: Left;  . Dilation and curettage of uterus    . Breast lumpectomy Left 12/05/2013    Procedure: BREAST LUMPECTOMY WITH EXCISION OF SENTINEL NODE;  Surgeon: [name], MD;  Location: [place];  Service: General;  Laterality:  Left;  . Re-excision of breast lumpectomy Left 01/01/2014    Procedure: RE-EXCISION OF left BREAST cancer ;  Surgeon: [name], MD;  Location: [place];  Service: General;  Laterality: Left;    There were no vitals filed for this visit.  Visit Diagnosis:  Lymphedema of breast      Subjective Assessment - 11/02/15 1309    Subjective I have a sinus infection today. I picked up my Jovi pak today.   Pertinent History two left lumpectomys in 2015, sentinel node biopsy - removed 4, stage 2 tumor, developed ceroma following lumpectomy and pt states she can still feel ceroma   Patient Stated Goals get breast smaller   Currently in Pain? No/denies   Pain Score 0-No pain                         [facility] Adult PT Treatment/Exercise - 11/02/15 0001    Manual Therapy   Manual Lymphatic Drainage (MLD) In supine, short neck, superficial and deep abdominals, right  axillay nodes and anterior interaxillary anastamosie, left inuinal nodes and left axillo-inguinal anastamosis left abdomen, chest and breast, then to right sidelying for left lateral trunk and breast. especailly around scar    Kinesiotix  Edema --                PT Education - 11/02/15 1346    Education provided Yes   Education Details how to wear jovi pak   Person(s) Educated Patient   Methods Explanation;Demonstration   Comprehension Verbalized understanding;Returned demonstration           Short Term Clinic Goals - 10/21/15 1202    CC Short Term Goal  #1   Title Pt will report at least a 25% decrease in left breast edema to decrease risk of cellulitis   Status Achieved   CC Short Term Goal  #2   Title Pt will report at least a 25 percent improvement in left breast fibrosis to improve comfort   Status Achieved             Long Term Clinic Goals - 10/30/15 1100    CC Long Term Goal  #1   Title Pt will demonstrate correct technique for self manual lymphatic drainage for long  term management of edema   Status On-going   CC Long Term Goal  #2   Title Pt will report at least a 75% improvement in left breast to decrease risk of cellulitis   Baseline Pt reports an 85% improvement   Status Achieved   CC Long Term Goal  #3   Title Pt will reports at least a 75% improvement in left breast fibrosis   Baseline 10/30/15- Pt states she has had a 50% improvement in fibrosis   Status On-going   CC Long Term Goal  #4   Title Pt will score less than 15 percent impairment on the lymphedema life impact scale   Baseline 25 percent   Status On-going            Plan - 11/02/15 1343    Clinical Impression Statement Pt recieved JoviPak and was educated on how to wear it. Pt still has some fibrosis just medial to nipple on left breast. Her skin was red where she had removed kinesiotape so it was not reapplied tihs visit.    Pt will benefit from skilled therapeutic intervention in order to improve on the following deficits Pain;Increased edema;Increased fascial restricitons   Rehab Potential Excellent   Clinical Impairments Affecting Rehab Potential pt has hx of radiation   PT Treatment/Interventions Passive range of motion;Therapeutic exercise;Manual techniques;Manual lymph drainage;Taping;Vasopneumatic Device;Scar mobilization;Compression bandaging   PT Next Visit Plan give LLIS again, assess self drainage technique, continue MLD to left breast   Consulted and Agree with Plan of Care Patient        Problem List Patient Active Problem List   Diagnosis Date Noted  . Family history of malignant neoplasm of breast 01/09/2014  . Cancer of lower-inner quadrant of left female breast ([place]) 11/25/2013  . Acute medial meniscal tear 08/27/2013  . OSA (obstructive sleep apnea) 04/30/2013  . PLMD (periodic limb movement disorder) 04/30/2013  . Essential hypertension, benign 04/30/2013  . PVC's (premature ventricular contractions) 04/30/2013    Alexia Freestone 11/02/2015, 1:47  PM  [place], [place], [address] Phone: [phone]   Fax:  [phone]  Name: Connie [name] MRN: [id number] Date of Birth: 04-May-1951    Allyson Sabal, PT 11/02/2015 1:47 PM

## 2015-11-04 ENCOUNTER — Ambulatory Visit: Payer: BLUE CROSS/BLUE SHIELD

## 2015-11-04 DIAGNOSIS — I890 Lymphedema, not elsewhere classified: Secondary | ICD-10-CM

## 2015-11-04 NOTE — Therapy (Signed)
[place], [place], [address] Phone: [phone]   Fax:  [phone]  Physical Therapy Treatment  Patient Details  Name: Connie [name] MRN: [id number] Date of Birth: Oct 24, 1950 Referring Provider: Barry Dienes  Encounter Date: 11/04/2015      PT End of Session - 11/04/15 1109    Visit Number 10   Number of Visits 24   Date for PT Re-Evaluation 12/09/15   PT Start Time 1022   PT Stop Time 1108   PT Time Calculation (min) 46 min   Activity Tolerance Patient tolerated treatment well   Behavior During Therapy [facility] for tasks assessed/performed      Past Medical History  Diagnosis Date  . IBS (irritable bowel syndrome)   . GERD (gastroesophageal reflux disease)   . High cholesterol   . Depression   . PLMD (periodic limb movement disorder) 04/30/2013  . PVC's (premature ventricular contractions) 04/30/2013  . Bronchitis, chronic ([place])   . Headache(784.0)     MIGRAINES  . Meniscus tear     LEFT x 2  . Arthritis     L FOOT  . OSA (obstructive sleep apnea) 04/30/2013    USES C -PAP  . Pneumonia   . Anxiety   . Diverticulitis   . History of shingles Feb. 2015  . Wears glasses   . Cancer [facility])     left breast cancer  . Breast cancer ([place]) 11/13/13    left breast invasive ductal ca,  . Radiation 02/06/14-03/24/14    Left breast ductal cancer    Past Surgical History  Procedure Laterality Date  . Knee surgery Left 2000-2001     RT KNEE X1  . Appendectomy  1984  . Osteotomy      RT KNEE  . Knee arthroscopy Left 08/28/2013    Procedure: LEFT KNEE ARTHROSCOPY WITH MEDIAL AND LATERAL DEBRIDEMENT;  Surgeon: Gearlean Alf, MD;  [facility];  Service: Orthopedics;  Laterality: Left;  . Dilation and curettage of uterus    . Breast lumpectomy Left 12/05/2013    Procedure: BREAST LUMPECTOMY WITH EXCISION OF SENTINEL NODE;  Surgeon: [name], MD;  Location: [place];  Service: General;  Laterality:  Left;  . Re-excision of breast lumpectomy Left 01/01/2014    Procedure: RE-EXCISION OF left BREAST cancer ;  Surgeon: [name], MD;  Location: [place];  Service: General;  Laterality: Left;    There were no vitals filed for this visit.  Visit Diagnosis:  Lymphedema of breast      Subjective Assessment - 11/04/15 1025    Subjective Got my Jovi pack finally and I really like it it just really makes me sweat!  Was doing the manual lymph drainage on myself last night and could feel the fluid moving.   Pertinent History two left lumpectomys in 2015, sentinel node biopsy - removed 4, stage 2 tumor, developed ceroma following lumpectomy and pt states she can still feel ceroma   Patient Stated Goals get breast smaller   Currently in Pain? No/denies            [facility] PT Assessment - 11/04/15 0001    Observation/Other Assessments   Other Surveys  --  Lymphedema Life Impact scale - 4% impairment                     [facility] Adult PT Treatment/Exercise - 11/04/15 0001    Manual Therapy   Manual Lymphatic Drainage (  MLD) In supine, short neck, superficial and deep abdominals, right  axillay nodes and anterior interaxillary anastamosie, left inuinal nodes and left axillo-inguinal anastamosis left abdomen, chest and breast, then to right sidelying for left lateral trunk and breast. especailly around scar    Kinesiotix   Edema kinesiotape in I band over scar on Lt breast medial to areolar                   Short Term Clinic Goals - 10/21/15 1202    CC Short Term Goal  #1   Title Pt will report at least a 25% decrease in left breast edema to decrease risk of cellulitis   Status Achieved   CC Short Term Goal  #2   Title Pt will report at least a 25 percent improvement in left breast fibrosis to improve comfort   Status Achieved             Long Term Clinic Goals - 11/04/15 1240    CC Long Term Goal  #4   Title Pt will score less than 15  percent impairment on the lymphedema life impact scale  Scored 3% impairment 11/04/15   Status Achieved            Plan - 11/04/15 1235    Clinical Impression Statement Pt has been doing excellent overall with therapy. Her LLIS score improved to 3% impairement. She is compliant with her use/wear of compression bra and now Jovi insert, and is compliant and independent with her self manual lymph drainage and notes that she can feel improvement (tissue softer) when she has finished. Also feels like she understands how to apply kinesiotape and will look into getting this as well as she reports this seems to help tremendously with fibrosis at medial incision. Pt is prepared for D/C next visit. and will benefit from review of her self manual lymph drainage technique one more time before extended roadtrip.    Pt will benefit from skilled therapeutic intervention in order to improve on the following deficits Pain;Increased edema;Increased fascial restricitons   Rehab Potential Excellent   Clinical Impairments Affecting Rehab Potential pt has hx of radiation   PT Treatment/Interventions Passive range of motion;Therapeutic exercise;Manual techniques;Manual lymph drainage;Taping;Vasopneumatic Device;Scar mobilization;Compression bandaging   PT Next Visit Plan D/C next visit; assess self drainage technique, continue MLD to left breast.   Consulted and Agree with Plan of Care Patient        Problem List Patient Active Problem List   Diagnosis Date Noted  . Family history of malignant neoplasm of breast 01/09/2014  . Cancer of lower-inner quadrant of left female breast ([place]) 11/25/2013  . Acute medial meniscal tear 08/27/2013  . OSA (obstructive sleep apnea) 04/30/2013  . PLMD (periodic limb movement disorder) 04/30/2013  . Essential hypertension, benign 04/30/2013  . PVC's (premature ventricular contractions) 04/30/2013    Otelia Limes, PTA 11/04/2015, 12:42 PM  [place], [place], [address] Phone: [phone]   Fax:  [phone]  Name: Connie [name] MRN: [id number] Date of Birth: 1950/09/06

## 2015-11-06 ENCOUNTER — Ambulatory Visit: Payer: BLUE CROSS/BLUE SHIELD | Admitting: Physical Therapy

## 2015-11-06 DIAGNOSIS — I890 Lymphedema, not elsewhere classified: Secondary | ICD-10-CM

## 2015-11-06 NOTE — Therapy (Signed)
[place], [place], [address] Phone: [phone]   Fax:  [phone]  Physical Therapy Treatment  Patient Details  Name: Connie [name] MRN: [id number] Date of Birth: 1951/04/02 Referring Provider: Barry Dienes  Encounter Date: 11/06/2015      PT End of Session - 11/06/15 1159    Visit Number 11   Number of Visits 24   Date for PT Re-Evaluation 12/09/15   PT Start Time 1110   PT Stop Time 1152   PT Time Calculation (min) 42 min   Activity Tolerance Patient tolerated treatment well   Behavior During Therapy [facility] for tasks assessed/performed      Past Medical History  Diagnosis Date  . IBS (irritable bowel syndrome)   . GERD (gastroesophageal reflux disease)   . High cholesterol   . Depression   . PLMD (periodic limb movement disorder) 04/30/2013  . PVC's (premature ventricular contractions) 04/30/2013  . Bronchitis, chronic ([place])   . Headache(784.0)     MIGRAINES  . Meniscus tear     LEFT x 2  . Arthritis     L FOOT  . OSA (obstructive sleep apnea) 04/30/2013    USES C -PAP  . Pneumonia   . Anxiety   . Diverticulitis   . History of shingles Feb. 2015  . Wears glasses   . Cancer [facility])     left breast cancer  . Breast cancer ([place]) 11/13/13    left breast invasive ductal ca,  . Radiation 02/06/14-03/24/14    Left breast ductal cancer    Past Surgical History  Procedure Laterality Date  . Knee surgery Left 2000-2001     RT KNEE X1  . Appendectomy  1984  . Osteotomy      RT KNEE  . Knee arthroscopy Left 08/28/2013    Procedure: LEFT KNEE ARTHROSCOPY WITH MEDIAL AND LATERAL DEBRIDEMENT;  Surgeon: Gearlean Alf, MD;  [facility];  Service: Orthopedics;  Laterality: Left;  . Dilation and curettage of uterus    . Breast lumpectomy Left 12/05/2013    Procedure: BREAST LUMPECTOMY WITH EXCISION OF SENTINEL NODE;  Surgeon: [name], MD;  Location: [place];  Service: General;  Laterality:  Left;  . Re-excision of breast lumpectomy Left 01/01/2014    Procedure: RE-EXCISION OF left BREAST cancer ;  Surgeon: [name], MD;  Location: [place];  Service: General;  Laterality: Left;    There were no vitals filed for this visit.      Subjective Assessment - 11/06/15 1158    Subjective My swelling has been doing really good.    Pertinent History two left lumpectomys in 2015, sentinel node biopsy - removed 4, stage 2 tumor, developed ceroma following lumpectomy and pt states she can still feel ceroma   Patient Stated Goals get breast smaller   Currently in Pain? No/denies   Pain Score 0-No pain                         [facility] Adult PT Treatment/Exercise - 11/06/15 0001    Manual Therapy   Manual Lymphatic Drainage (MLD) In supine, short neck, superficial and deep abdominals, right  axillay nodes and anterior interaxillary anastamosie, left inuinal nodes and left axillo-inguinal anastamosis left abdomen, chest and breast, then to right sidelying for left lateral trunk and breast. especailly around scar    Kinesiotix   Edema kinesiotape in I band over scar on Lt breast  medial to areolar                PT Education - 11/06/15 1202    Education provided Yes   Education Details self MLD   Person(s) Educated Patient;Spouse   Methods Explanation;Demonstration;Tactile cues;Verbal cues   Comprehension Verbalized understanding;Returned demonstration           Short Term Clinic Goals - 10/21/15 1202    CC Short Term Goal  #1   Title Pt will report at least a 25% decrease in left breast edema to decrease risk of cellulitis   Status Achieved   CC Short Term Goal  #2   Title Pt will report at least a 25 percent improvement in left breast fibrosis to improve comfort   Status Achieved             Long Term Clinic Goals - 11/06/15 1153    CC Long Term Goal  #1   Title Pt will demonstrate correct technique for self manual lymphatic  drainage for long term management of edema   Baseline pt verbalizes correct self MLD skills, caregiver also present   Status Achieved   CC Long Term Goal  #2   Title Pt will report at least a 75% improvement in left breast to decrease risk of cellulitis   Baseline Pt reports an 85% improvement   Status Achieved   CC Long Term Goal  #3   Title Pt will reports at least a 75% improvement in left breast fibrosis   Baseline 10/30/15- Pt states she has had a 50% improvement in fibrosis, 11/06/15- Pt reports an 85-90% improvement in fibrosis   Status Achieved   CC Long Term Goal  #4   Title Pt will score less than 15 percent impairment on the lymphedema life impact scale   Status Achieved            Plan - 11/06/15 1159    Clinical Impression Statement Pt's husband was present today during the session and was educated on correct technique for manual lymphatic drainage. Pt and husband report ability to perform self MLD at home following demonstration. Pt has met all goals for therapy and is to be discharged from skilled PT services at this time.    Rehab Potential Excellent   Clinical Impairments Affecting Rehab Potential pt has hx of radiation   PT Treatment/Interventions Passive range of motion;Therapeutic exercise;Manual techniques;Manual lymph drainage;Taping;Vasopneumatic Device;Scar mobilization;Compression bandaging   PT Next Visit Plan d/c this visit   Consulted and Agree with Plan of Care Patient;Family member/caregiver      Patient will benefit from skilled therapeutic intervention in order to improve the following deficits and impairments:  Pain, Increased edema, Increased fascial restricitons  Visit Diagnosis: Lymphedema of breast     Problem List Patient Active Problem List   Diagnosis Date Noted  . Family history of malignant neoplasm of breast 01/09/2014  . Cancer of lower-inner quadrant of left female breast ([place]) 11/25/2013  . Acute medial meniscal tear 08/27/2013   . OSA (obstructive sleep apnea) 04/30/2013  . PLMD (periodic limb movement disorder) 04/30/2013  . Essential hypertension, benign 04/30/2013  . PVC's (premature ventricular contractions) 04/30/2013    Alexia Freestone 11/06/2015, 12:05 PM  [place], [place], [address] Phone: [phone]   Fax:  [phone]  Name: Connie [name] MRN: [id number] Date of Birth: 04-08-1951    Allyson Sabal, PT 11/06/2015 12:05 PM

## 2015-11-25 ENCOUNTER — Ambulatory Visit: Payer: BLUE CROSS/BLUE SHIELD | Admitting: Oncology

## 2015-11-25 ENCOUNTER — Other Ambulatory Visit: Payer: BLUE CROSS/BLUE SHIELD

## 2015-12-30 ENCOUNTER — Other Ambulatory Visit: Payer: Self-pay | Admitting: *Deleted

## 2015-12-30 DIAGNOSIS — C50312 Malignant neoplasm of lower-inner quadrant of left female breast: Secondary | ICD-10-CM

## 2015-12-31 ENCOUNTER — Other Ambulatory Visit (HOSPITAL_BASED_OUTPATIENT_CLINIC_OR_DEPARTMENT_OTHER): Payer: BLUE CROSS/BLUE SHIELD

## 2015-12-31 ENCOUNTER — Telehealth: Payer: Self-pay | Admitting: Oncology

## 2015-12-31 ENCOUNTER — Ambulatory Visit
Admission: RE | Admit: 2015-12-31 | Discharge: 2015-12-31 | Disposition: A | Discharge status: Home / Self Care | Payer: BLUE CROSS/BLUE SHIELD | Source: Ambulatory Visit | Attending: Oncology | Admitting: Oncology

## 2015-12-31 ENCOUNTER — Ambulatory Visit (HOSPITAL_BASED_OUTPATIENT_CLINIC_OR_DEPARTMENT_OTHER): Payer: BLUE CROSS/BLUE SHIELD | Admitting: Oncology

## 2015-12-31 VITALS — BP 158/76 | HR 62 | Temp 98.1°F | Resp 18 | Ht 64.0 in | Wt 202.3 lb

## 2015-12-31 DIAGNOSIS — C50312 Malignant neoplasm of lower-inner quadrant of left female breast: Secondary | ICD-10-CM

## 2015-12-31 DIAGNOSIS — C50812 Malignant neoplasm of overlapping sites of left female breast: Secondary | ICD-10-CM

## 2015-12-31 DIAGNOSIS — Z170 Estrogen receptor positive status [ER+]: Secondary | ICD-10-CM | POA: Diagnosis not present

## 2015-12-31 DIAGNOSIS — Z853 Personal history of malignant neoplasm of breast: Secondary | ICD-10-CM

## 2015-12-31 DIAGNOSIS — Z79810 Long term (current) use of selective estrogen receptor modulators (SERMs): Secondary | ICD-10-CM

## 2015-12-31 LAB — CBC WITH DIFFERENTIAL/PLATELET
BASO%: 0.1 % (ref 0.0–2.0)
BASOS ABS: 0 10*3/uL (ref 0.0–0.1)
EOS%: 3.1 % (ref 0.0–7.0)
Eosinophils Absolute: 0.2 10*3/uL (ref 0.0–0.5)
HCT: 38.9 % (ref 34.8–46.6)
HGB: 13.2 g/dL (ref 11.6–15.9)
LYMPH%: 32 % (ref 14.0–49.7)
MCH: 30.8 pg (ref 25.1–34.0)
MCHC: 33.9 g/dL (ref 31.5–36.0)
MCV: 90.7 fL (ref 79.5–101.0)
MONO#: 0.7 10*3/uL (ref 0.1–0.9)
MONO%: 9.8 % (ref 0.0–14.0)
NEUT#: 3.8 10*3/uL (ref 1.5–6.5)
NEUT%: 55 % (ref 38.4–76.8)
Platelets: 204 10*3/uL (ref 145–400)
RBC: 4.29 10*6/uL (ref 3.70–5.45)
RDW: 13.5 % (ref 11.2–14.5)
WBC: 6.9 10*3/uL (ref 3.9–10.3)
lymph#: 2.2 10*3/uL (ref 0.9–3.3)

## 2015-12-31 LAB — COMPREHENSIVE METABOLIC PANEL
ALT: 24 U/L (ref 0–55)
ANION GAP: 8 meq/L (ref 3–11)
AST: 24 U/L (ref 5–34)
Albumin: 3.7 g/dL (ref 3.5–5.0)
Alkaline Phosphatase: 68 U/L (ref 40–150)
BUN: 13.3 mg/dL (ref 7.0–26.0)
CALCIUM: 9.6 mg/dL (ref 8.4–10.4)
CHLORIDE: 105 meq/L (ref 98–109)
CO2: 26 meq/L (ref 22–29)
CREATININE: 0.8 mg/dL (ref 0.6–1.1)
EGFR: 75 mL/min/{1.73_m2} — ABNORMAL LOW (ref >90)
Glucose: 89 mg/dl (ref 70–140)
POTASSIUM: 4.2 meq/L (ref 3.5–5.1)
Sodium: 139 mEq/L (ref 136–145)
Total Bilirubin: 0.36 mg/dL (ref 0.20–1.20)
Total Protein: 7 g/dL (ref 6.4–8.3)

## 2015-12-31 NOTE — Progress Notes (Signed)
[facility]  Telephone:(336) [phone] Fax:(336) [phone]     ID: Connie [name] DOB: 1950/11/29  MR#: [id number]  IFO#:[id number]  PCP: Marton Redwood, MD GYN: [name] SU: [name] OTHER MD: [name]. [name]  CHIEF COMPLAINT: Estrogen receptor positive breast cancer  CURRENT TREATMENT:  tamoxifen   BREAST CANCER HISTORY: From the original intake note:  The patient herself palpated a mass in her left breast around Christmastime 2014 but was about to undergo knee surgery at the time and did not immediately followup on this. On 11/06/2013 she brought it to Dr. [name] attention and he scheduled her for bilateral diagnostic mammography and left ultrasonography at the breast Center. This showed a 2.4 cm mass in the left lower inner quadrant, with lobulated margins. This was palpable and appeared fixed. Ultrasound confirmed an irregular hypoechoic solid mass at the 8:30 position 5 cm from the nipple measuring 2.1 cm. Medial to this was an oval hypoechoic mass measuring 0.7 cm and inferior to this there were at least 2 additional hypoechoic nodules, the largest measuring 0.8 cm. Ultrasound of the left axilla was negative.  Ultrasound-guided biopsy of the 2 more prominent left breast masses 11/13/2013 showed ([id number]) one of the masses to be benign. The other one was an invasive ductal carcinoma, grade 1 estrogen receptor 100% positive, progesterone receptor 91% positive, both with strong staining intensity, with an MIB-1 of 89%, and no HER-2 amplification, the signals ratio being 0.93 and the number per cell 1.95.  On 11/21/2013 the patient underwent bilateral breast MRI. This showed, in the 8:00 position of the left breast, a 2.6 cm enhancing mass with irregular borders. Lateral to this there was a second biopsy marker clip surrounded by minimal enhancement. There was no mass in that area. Ultrasound had shown evidence of possible satellite masses inferior  to the dominant mass but these were not seen by MRI. There were no other findings, the right breast was normal, and the left axilla showed no abnormal notes.  On 12/05/2013 the patient underwent left lumpectomy and sentinel lymph node sampling. This showed a 2.9 cm invasive ductal carcinoma, grade 3, less than a millimeter from the closest margin. 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 Estrogen receptor and progesterone receptor studies were obtained on the DCIS and are positive at 100% for the estrogen receptor and 39%, progesterone receptor, both with strong staining intensity.  On 01/01/2014 the patient underwent additional surgery for margin clearance. This showed no residual tumor.  In addition, an Oncotype scan was obtained from the invasive tumor (lumpectomy specimen) and showed a score of 15, predicting a ten-year risk of outside the breast recurrence of 10% if the patient's only systemic treatment was tamoxifen for 5 years. It also predicted no benefit from chemotherapy.  The patient was evaluated by Dr. Pablo Ledger and started radiation treatments 02/11/2014. She also met with genetics, since there was a documented BRCA 2 mutation in a first cousin. The patient was tested specifically for this mutation, and was found to be negative.  Her subsequent history is as detailed below  INTERVAL HISTORY: Connie [name] returns today for follow-up of her estrogen receptor positive breast cancer. She continues on tamoxifen, with excellent tolerance. She still does have some hot flashes, but they are occasional. She went off the venlafaxine briefly and inadvertently and found the hot flashes were more of a problem. She is now on venlafaxine and clonidine and that takes care of the  problem fairly well  Her big news of course is that she is living in a 43 foot long mobile home. They just returned last night from [place] trip to come over a different states. They're about to  leave for [place] trip. She is planning to spend time with her mother who has Louwy body dementia  REVIEW OF SYSTEMS: Connie [name] is not exercising regularly. She tries to do some walking, but her knees are poor. She is concerned about her weight. She is short of breath with activity, and sometimes can't get all the way to the top when walking up a flight of stairs. She has stress urinary incontinence. Because she was doing some extra work around the house, she got some swelling of her left breast which was very uncomfortable. She is getting physical therapy for that and it is working well for her she does have back and joint pain here and there which is not more intense or persistent than before. A detailed review of systems today was otherwise stable.   PAST MEDICAL HISTORY: Past Medical History  Diagnosis Date  . IBS (irritable bowel syndrome)   . GERD (gastroesophageal reflux disease)   . High cholesterol   . Depression   . PLMD (periodic limb movement disorder) 04/30/2013  . PVC's (premature ventricular contractions) 04/30/2013  . Bronchitis, chronic ([place])   . Headache(784.0)     MIGRAINES  . Meniscus tear     LEFT x 2  . Arthritis     L FOOT  . OSA (obstructive sleep apnea) 04/30/2013    USES C -PAP  . Pneumonia   . Anxiety   . Diverticulitis   . History of shingles Feb. 2015  . Wears glasses   . Cancer [facility])     left breast cancer  . Breast cancer ([place]) 11/13/13    left breast invasive ductal ca,  . Radiation 02/06/14-03/24/14    Left breast ductal cancer    PAST SURGICAL HISTORY: Past Surgical History  Procedure Laterality Date  . Knee surgery Left 2000-2001     RT KNEE X1  . Appendectomy  1984  . Osteotomy      RT KNEE  . Knee arthroscopy Left 08/28/2013    Procedure: LEFT KNEE ARTHROSCOPY WITH MEDIAL AND LATERAL DEBRIDEMENT;  Surgeon: Gearlean Alf, MD;  Location: [facility];  Service: Orthopedics;  Laterality: Left;  . Dilation and curettage of uterus    . Breast  lumpectomy Left 12/05/2013    Procedure: BREAST LUMPECTOMY WITH EXCISION OF SENTINEL NODE;  Surgeon: [name], MD;  Location: [facility];  Service: General;  Laterality: Left;  . Re-excision of breast lumpectomy Left 01/01/2014    Procedure: RE-EXCISION OF left BREAST cancer ;  Surgeon: [name], MD;  Location: [facility];  Service: General;  Laterality: Left;    FAMILY HISTORY Family History  Problem Relation Age of Onset  . Cancer Father 55    esophageal and prostate cancer - smoker  . Cancer Paternal Aunt     breast at unknown age  . Cancer Cousin 87    Breast cancer in a paternal first cousin; BRCA2 + , IVS5-2A>G. Tested at [facility]  . Cancer Cousin 52    breast cancer in a paternal first cousin (sister to the cousin with the BRCA2 mutation)  . Learning disabilities Mother    the patient's father died from esophageal cancer at age 53. The patient's mother is living, at age 57. The patient had one  brother, 2 sisters. One of the patient's father's 2 sisters was diagnosed with breast cancer and that aunt has 3 daughters, one of whom had breast cancer at an early age. The daughter was tested and was found to carry a BRCA2 mutation which Faatima however does not carry.  GYNECOLOGIC HISTORY:  No LMP recorded. Patient is postmenopausal. Menarche age 26, first live birth age 31, which the patient understands increases the risk of breast cancer. She stopped having periods approximately 10 years ago and has been on hormone replacement since that time, stopping in April of 2015.  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 Her husband Frederico Hamman worked as an Clinical biochemist. He also recently retired. This is his second marriage for both of them. She has 2 children from her first marriage, Gaylord Shih, who works in [employer] in [place], and [employer], who  works in Pharmacologist and lives in [place]. Frederico Hamman has 2 children, Anderson Malta lives in [place] and Powers who lives in [place]. Anderson Malta is a stay-at-home mom Wendi Maya is a Education officer, museum. They have one grandson, who lives in [place]: In place   HEALTH MAINTENANCE: Social History  Substance Use Topics  . Smoking status: Never Smoker   . Smokeless tobacco: Never Used  . Alcohol Use: Yes     Comment: SOCIALLY     Colonoscopy: 2009, under lab our. She also had EGD at the same time given her father's history of esophageal cancer  PAP: 2014  Bone density: 2014 at [facility]; lowest T score was -0.9 (normal)  Lipid panel:  No Known Allergies  Current Outpatient Prescriptions  Medication Sig Dispense Refill  . aspirin 81 MG tablet Take 81 mg by mouth daily.    Marland Kitchen atenolol (TENORMIN) 25 MG tablet Take 25 mg by mouth every morning.     Marland Kitchen atorvastatin (LIPITOR) 40 MG tablet Take 40 mg by mouth every evening.     Marland Kitchen azithromycin (ZITHROMAX) 500 MG tablet Take by mouth daily.    Marland Kitchen b complex vitamins tablet Take 1 tablet by mouth daily.    . butalbital-aspirin-caffeine (FIORINAL) 50-325-40 MG per capsule Take 1-2 capsules by mouth 2 (two) times daily as needed for headache.     . cloNIDine (CATAPRES - DOSED IN MG/24 HR) 0.1 mg/24hr patch PLACE 1 PATCH ONTO THE SKIN ONCE A WEEK 12 patch 11  . cyclobenzaprine (FLEXERIL) 10 MG tablet Take 10 mg by mouth daily as needed for muscle spasms.    . diclofenac sodium (VOLTAREN) 1 % GEL Apply 4 g topically 4 (four) times daily as needed. pain    . diphenhydrAMINE (BENADRYL) 25 MG tablet Take 50 mg by mouth at bedtime.    . fluticasone (FLONASE) 50 MCG/ACT nasal spray     . glucosamine-chondroitin 500-400 MG tablet Take 1 tablet by mouth 2 (two) times daily.    . hyoscyamine (LEVSIN, ANASPAZ) 0.125 MG tablet     . magnesium oxide (MAG-OX) 400 MG tablet Take 800 mg by mouth daily. Pt takes [phone] mg daily for hotflashes.    .  Multiple Vitamin (MULTIVITAMIN) tablet Take 1 tablet by mouth daily.    Marland Kitchen PRISTIQ 100 MG 24 hr tablet Take 100 mg by mouth daily. Reported on 10/14/2015    . tamoxifen (NOLVADEX) 20 MG tablet TAKE 1 TABLET DAILY 90 tablet 11  . traMADol (ULTRAM) 50  MG tablet     . venlafaxine (EFFEXOR) 75 MG tablet Take 75 mg by mouth once.     No current facility-administered medications for this visit.    OBJECTIVE: Middle-aged white woman who appears well  Filed Vitals:   12/31/15 1452  BP: 158/76  Pulse: 62  Temp: 98.1 F (36.7 C)  Resp: 18     Body mass index is 34.71 kg/(m^2).    ECOG FS:1 - Symptomatic but completely ambulatory  Sclerae unicteric, pupils round and equal Oropharynx clear and moist-- no thrush or other lesions No cervical or supraclavicular adenopathy Lungs no rales or rhonchi Heart regular rate and rhythm Abd soft, obese, nontender, positive bowel sounds MSK no focal spinal tenderness, no upper extremity lymphedema Neuro: nonfocal, well oriented, appropriate affect Breasts: The right breast is unremarkable. The left breast is status post lumpectomy and radiation. There is minimal swelling. There is no erythema. There is no evidence of local recurrence. The left axilla is benign.  LAB RESULTS:  CMP     Component Value Date/Time   NA 139 12/31/2015 1444   NA 141 12/03/2013 0939   K 4.2 12/31/2015 1444   K 4.5 12/03/2013 0939   CL 103 12/03/2013 0939   CO2 26 12/31/2015 1444   CO2 25 12/03/2013 0939   GLUCOSE 89 12/31/2015 1444   GLUCOSE 109* 12/03/2013 0939   BUN 13.3 12/31/2015 1444   BUN 12 12/03/2013 0939   CREATININE 0.8 12/31/2015 1444   CREATININE 0.79 12/03/2013 0939   CALCIUM 9.6 12/31/2015 1444   CALCIUM 9.8 12/03/2013 0939   PROT 7.0 12/31/2015 1444   PROT 7.0 12/15/2008 0743   ALBUMIN 3.7 12/31/2015 1444   ALBUMIN 3.8 12/15/2008 0743   AST 24 12/31/2015 1444   AST 30 12/15/2008 0743   ALT 24 12/31/2015 1444   ALT 19 12/15/2008 0743   ALKPHOS 68  12/31/2015 1444   ALKPHOS 68 12/15/2008 0743   BILITOT 0.36 12/31/2015 1444   BILITOT 0.8 12/15/2008 0743   GFRNONAA 88* 12/03/2013 0939   GFRAA >90 12/03/2013 0939    I No results found for: SPEP  Lab Results  Component Value Date   WBC 6.9 12/31/2015   NEUTROABS 3.8 12/31/2015   HGB 13.2 12/31/2015   HCT 38.9 12/31/2015   MCV 90.7 12/31/2015   PLT 204 12/31/2015      Chemistry      Component Value Date/Time   NA 139 12/31/2015 1444   NA 141 12/03/2013 0939   K 4.2 12/31/2015 1444   K 4.5 12/03/2013 0939   CL 103 12/03/2013 0939   CO2 26 12/31/2015 1444   CO2 25 12/03/2013 0939   BUN 13.3 12/31/2015 1444   BUN 12 12/03/2013 0939   CREATININE 0.8 12/31/2015 1444   CREATININE 0.79 12/03/2013 0939      Component Value Date/Time   CALCIUM 9.6 12/31/2015 1444   CALCIUM 9.8 12/03/2013 0939   ALKPHOS 68 12/31/2015 1444   ALKPHOS 68 12/15/2008 0743   AST 24 12/31/2015 1444   AST 30 12/15/2008 0743   ALT 24 12/31/2015 1444   ALT 19 12/15/2008 0743   BILITOT 0.36 12/31/2015 1444   BILITOT 0.8 12/15/2008 0743       No results found for: LABCA2  No components found for: LABCA125  No results for input(s): INR in the last 168 hours.  Urinalysis    Component Value Date/Time   COLORURINE YELLOW 12/15/2008 0701   APPEARANCEUR HAZY* 12/15/2008 0701  LABSPEC 1.031* 12/15/2008 0701   PHURINE 5.5 12/15/2008 0701   GLUCOSEU NEGATIVE 12/15/2008 0701   HGBUR TRACE* 12/15/2008 0701   BILIRUBINUR NEGATIVE 12/15/2008 0701   KETONESUR NEGATIVE 12/15/2008 0701   PROTEINUR NEGATIVE 12/15/2008 0701   UROBILINOGEN 0.2 12/15/2008 0701   NITRITE NEGATIVE 12/15/2008 0701   LEUKOCYTESUR NEGATIVE 12/15/2008 0701    STUDIES: Mm Diag Breast Tomo Bilateral  12/31/2015  CLINICAL DATA:  65 year old with malignant lumpectomy of the lower inner quadrant of the left breast in May, 2015 with adjuvant radiation therapy. Annual evaluation. EXAM: 2D DIGITAL DIAGNOSTIC BILATERAL MAMMOGRAM  WITH CAD AND ADJUNCT TOMO COMPARISON:  11/10/2014 (bilateral), 11/13/2013 (left), 11/16/2013 (bilateral). ACR Breast Density Category b: There are scattered areas of fibroglandular density. FINDINGS: Standard 2D and tomosynthesis CC and MLO views of both breasts, standard 2D and tomosynthesis laterally rolled CC view of the right breast, and standard spot magnification tangential CC views of the lumpectomy site in the left breast were obtained. Post lumpectomy scarring in the lower inner left breast with interval resolution of the previously identified seroma. Post radiation skin thickening and trabecular thickening involving the left breast. Surgical clips in the left axilla from prior node dissection. No new or suspicious findings in the left breast. No findings suspicious for malignancy in the right breast. Mammographic images were processed with CAD. IMPRESSION: No mammographic evidence of malignancy involving either breast. Expected post lumpectomy and post radiation changes in the left breast with resolution of the previously identified seroma at the lumpectomy site. RECOMMENDATION: Bilateral diagnostic mammography in 1 year. I have discussed the findings and recommendations with the patient. Results were also provided in writing at the conclusion of the visit. If applicable, a reminder letter will be sent to the patient regarding the next appointment. BI-RADS CATEGORY  2: Benign. Electronically Signed   By: Evangeline Dakin M.D.   On: 12/31/2015 11:02     ASSESSMENT: 65 y.o. [place] woman s/p left breast biopsy 11/13/2013 for a clinical T2 N0, stage IIA invasive ductal carcinoma, grade 1, estrogen and progesterone receptor positive, HER-2 negative, with an MIB-1 of 89%  (1) status post left lumpectomy and sentinel lymph node sampling 12/05/2013 for a pT2 pN0, stage IIA invasive ductal carcinoma, grade 2, together with ductal carcinoma in situ, which was also estrogen and progesterone receptor  positive margins were involved  (2) status post additional surgery 01/01/2014 for margin clearance, showing no residual tumor  (3) Oncotype score of 15 predicted in a 10 year outside the breast risk of recurrence of 10% if the patient's only systemic treatment was tamoxifen for 5 years. It also predicted no benefit from chemotherapy  (4) adjuvant radiation completed 03/24/2014  (5) tamoxifen started 04/11/2014  (6) the patient was tested for a BRCA2 mutation documented in other family members, c.476-2A>G (IVS5-2A>G). She does not carry this mutation   PLAN: Kadisha is now 2 years out from definitive surgery for her breast cancer with no evidence of disease recurrence. This is very favorable.  Today we discussed switching to an aromatase inhibitor. The pluses are that she might get a couple of extra percentage points in risk reduction. The negatives of coursing include concerns regarding bone density and increased problems with hot flashes and arthralgias and myalgias.  At this point she prefers to continue on tamoxifen and I think that is reasonable. She will see Dr. [name] in the fall, and may be ready for repeat bone density at that time. We can discuss the switch to an  aromatase inhibitor further when she returns to see me in one year.  As far as her diet is concerned, if she really wants to lose weight she is going to have to cut the car erratically. She is not going to be able to exercise enough to burning off calories otherwise since she has significant knee problems and of course will be spending a lot of time writing under new 43 followed RV  She will see me again in one year. She knows to call for any problems that may develop before then.   Chauncey Cruel, MD   12/31/2015 3:44 PM

## 2015-12-31 NOTE — Telephone Encounter (Signed)
appt made and avs printed °

## 2016-01-15 ENCOUNTER — Other Ambulatory Visit: Payer: Self-pay | Admitting: *Deleted

## 2016-01-15 ENCOUNTER — Encounter: Payer: Self-pay | Admitting: Oncology

## 2016-01-15 MED ORDER — CLONIDINE HCL 0.1 MG/24HR TD PTWK: 0.1000 mg | MEDICATED_PATCH | TRANSDERMAL | Status: DC

## 2016-01-19 ENCOUNTER — Telehealth: Payer: Self-pay | Admitting: *Deleted

## 2016-01-19 NOTE — Telephone Encounter (Signed)
Call received from patient asking for samples or help with clonidine patch.  I am wearing the last one.  The [facility] tried vacation override and were denied instructing me to call my insurance."  No samples so this nurse called [facility].  "We tried for 45 minutes with insurance company and got no authorization.   Called [facility] to have order processed today BUT through regular mail.  [facility] suggested [facility] try override while she awaits this mail order.  Called [facility] with this information.  Tried 'other' override with no success.  Patient notified stating she will pay $100 out of pocket.

## 2016-05-10 ENCOUNTER — Ambulatory Visit: Payer: BLUE CROSS/BLUE SHIELD | Admitting: Neurology

## 2016-05-19 ENCOUNTER — Telehealth: Payer: Self-pay

## 2016-05-19 NOTE — Telephone Encounter (Signed)
I spoke to patient and asked her to bring SD card from CPAP machine to appt on Monday.

## 2016-05-22 ENCOUNTER — Encounter: Payer: Self-pay | Admitting: Neurology

## 2016-05-23 ENCOUNTER — Encounter: Payer: Self-pay | Admitting: Neurology

## 2016-05-23 ENCOUNTER — Ambulatory Visit (INDEPENDENT_AMBULATORY_CARE_PROVIDER_SITE_OTHER): Payer: BLUE CROSS/BLUE SHIELD | Admitting: Neurology

## 2016-05-23 VITALS — BP 122/68 | HR 72

## 2016-05-23 DIAGNOSIS — Z9989 Dependence on other enabling machines and devices: Secondary | ICD-10-CM | POA: Diagnosis not present

## 2016-05-23 DIAGNOSIS — G4733 Obstructive sleep apnea (adult) (pediatric): Secondary | ICD-10-CM

## 2016-05-23 NOTE — Patient Instructions (Addendum)
Keep up the good work! I will see you back in 12 months for sleep apnea check up. Continue to work on weight loss.      Marland Kitchen

## 2016-05-23 NOTE — Progress Notes (Signed)
Subjective:    Patient ID: Connie [name] is a 65 y.o. female.  HPI     Interim history:   Connie [name] is a very pleasant 65 year old right-handed woman with an underlying medical history of hypertension, lung disease, osteoarthritis, reflux disease, hyperlipidemia, depression, migraines, IBS, breast cancer (diagnosed with left-sided breast cancer in 2015 with status post left-sided lumpectomy on 12/05/2013 and second surgery on 01/01/2014, with hormone treatment and radiation therapy subsequently), and obesity, who presents for FU consultation of her obstructive sleep apnea, on treatment with CPAP. She is unaccompanied today. I last saw her on 05/11/2015, at which time she reported doing well with CPAP. She needed to switch DME providers. She had additional stressors, as her mother was diagnosed with Lewy body dementia, but thankfully her 2 sisters and 1 brother in [place] were able to help out and see her mom regularly. She also had knee surgery in 2015 with residual knee pain reported. She also developed shingles on the left side of her thorax in 2015. 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   Today, 05/23/16: I reviewed her CPAP compliance data from 04/23/2016 through 05/22/2016, which is a total of 30 days, during which time she used her machine every day with percent used days greater than 4 hours at 100%, indicating superb compliance with an average usage of 7 hours and 40 minutes, residual AHI 0.8 per hour, leak low, pressure at 8 cm.  Today, 05/23/16: She reports doing okay, has yearly physical coming up with Dr. [name]. Compliant with CPAP, sleeps well with it, travels a lot with their motor home.    Previously:  I saw her on 06/10/2013, at which time she reported feeling better and tolerating CPAP. She was compliant with treatment and felt, it was helpful.   I reviewed her CPAP compliance data from 04/11/2015 through 05/10/2015 which is a total of 30 days during which time she  used her machine every night with percent used days greater than 4 hours at 100%, indicating superb compliance with an average usage of 8 hours and 1 minute, residual AHI low at 1.2 per hour, leak very low with an average time of 8 seconds of leak per day, pressure at 8 cm.   I saw her on 04/30/2013, which time we discussed her sleep study findings and I suggested a trial of CPAP in the form of AutoPap. She was agreeable to this and I wanted to see her back after about a month of trying CPAP. I also reviewed compliance data in the interim from 05/03/2013 to 05/25/2013 which is a total of 23 days during which time she uses CPAP every day. Percent used days greater than 4 hours was 100%. Her CPAP mean pressure was 6.8, the 90th percentile was 8.2 cm. Her residual AHI was 0.7 per hour. Her average usage for all days was 7 hours and 38 minutes. This indicates excellent compliance. Also, based on the pressure summary I changed her CPAP setting to a set pressure of 8 cm. She uses a nasal pillows and tolerates it well. She sleeps better and feels better rested. She had no changes in her medical Hx of medications.   Her pressure will be adjusted in 2 days from now. She has an appointment with her DME company. I first met her on 03/06/2013, which time she reported daytime somnolence and snoring. 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 Based on her history  and exam I felt she had findings concerning for OSA and asked her to come back for sleep study. She had a diagnostic polysomnogram on 03/08/2013 and I went over her test results with her last time. Sleep efficiency was normal at 91.1% with a latency to sleep of 17 minutes. Wake after sleep onset was 27 minutes with moderate to severe sleep fragmentation noted. She had an increased percentage of stage I and 2 sleep, near absence of slow-wave sleep and a decreased percentage of REM sleep at 10.7% with a highly prolonged REM latency of 245 minutes. She  had mild periodic leg movements at 18.6 per hour resulting in very mild arousals of 3.3 per hour. She had occasional PVCs. She had mild snoring. She had a total of 39 obstructive hypopneas, rendering a borderline elevated AHI of 5.2 events per hour, with further elevation to 19.8 per hour in REM sleep. Her baseline oxygen saturation was 94%, her nadir was 85% and she spent 13 minutes and 8 seconds below the saturation of 90%.   She denies any recent chest pain or shortness of breath or palpitations. Her primary care physician checks her EKG regularly. She has no symptoms from her occasional PVCs that we saw on the sleep study.  Her Past Medical History Is Significant For: Past Medical History:  Diagnosis Date  . Anxiety   . Arthritis    L FOOT  . Breast cancer ([place]) 11/13/13   left breast invasive ductal ca,  . Bronchitis, chronic ([place])   . Cancer [facility])    left breast cancer  . Depression   . Diverticulitis   . GERD (gastroesophageal reflux disease)   . Headache(784.0)    MIGRAINES  . High cholesterol   . History of shingles Feb. 2015  . IBS (irritable bowel syndrome)   . Meniscus tear    LEFT x 2  . OSA (obstructive sleep apnea) 04/30/2013   USES C -PAP  . PLMD (periodic limb movement disorder) 04/30/2013  . Pneumonia   . PVC's (premature ventricular contractions) 04/30/2013  . Radiation 02/06/14-03/24/14   Left breast ductal cancer  . Wears glasses     Her Past Surgical History Is Significant For: Past Surgical History:  Procedure Laterality Date  . APPENDECTOMY  1984  . BREAST LUMPECTOMY Left 12/05/2013   Procedure: BREAST LUMPECTOMY WITH EXCISION OF SENTINEL NODE;  Surgeon: [name], MD;  Location: [facility];  Service: General;  Laterality: Left;  . DILATION AND CURETTAGE OF UTERUS    . KNEE ARTHROSCOPY Left 08/28/2013   Procedure: LEFT KNEE ARTHROSCOPY WITH MEDIAL AND LATERAL DEBRIDEMENT;  Surgeon: Gearlean Alf, MD;  Location: [facility];  Service: Orthopedics;  Laterality: Left;   . KNEE SURGERY Left 2000-2001    RT KNEE X1  . OSTEOTOMY     RT KNEE  . RE-EXCISION OF BREAST LUMPECTOMY Left 01/01/2014   Procedure: RE-EXCISION OF left BREAST cancer ;  Surgeon: [name], MD;  Location: [facility];  Service: General;  Laterality: Left;    Her Family History Is Significant For: Family History  Problem Relation Age of Onset  . Cancer Father 80    esophageal and prostate cancer - smoker  . Cancer Paternal Aunt     breast at unknown age  . Cancer Cousin 39    Breast cancer in a paternal first cousin; BRCA2 + , IVS5-2A>G. Tested at [employer]  . Cancer Cousin 63    breast cancer in a paternal first cousin (  sister to the cousin with the BRCA2 mutation)  . Learning disabilities Mother     Her Social History Is Significant For: Social History   Social History  . Marital status: Married    Spouse name: N/A  . Number of children: 4  . Years of education: college   Occupational History  . [employer].    Social History Main Topics  . Smoking status: Never Smoker  . Smokeless tobacco: Never Used  . Alcohol use Yes     Comment: SOCIALLY  . Drug use: No  . Sexual activity: Not Asked   Other Topics Concern  . None   Social History Narrative  . None    Her Allergies Are:  No Known Allergies:   Her Current Medications Are:  Outpatient Encounter Prescriptions as of 05/23/2016  Medication Sig  . aspirin 81 MG tablet Take 81 mg by mouth daily.  Marland Kitchen atenolol (TENORMIN) 25 MG tablet Take 25 mg by mouth every morning.   Marland Kitchen atorvastatin (LIPITOR) 40 MG tablet Take 40 mg by mouth every evening.   Marland Kitchen b complex vitamins tablet Take 1 tablet by mouth daily.  . butalbital-aspirin-caffeine (FIORINAL) 50-325-40 MG per capsule Take 1-2 capsules by mouth 2 (two) times daily as needed for headache.   . cloNIDine (CATAPRES - DOSED IN MG/24 HR) 0.1 mg/24hr patch Place 1 patch (0.1 mg total) onto the skin once a week.  . cyclobenzaprine (FLEXERIL) 10 MG  tablet Take 10 mg by mouth daily as needed for muscle spasms.  . diphenhydrAMINE (BENADRYL) 25 MG tablet Take 50 mg by mouth at bedtime.  . fluticasone (FLONASE) 50 MCG/ACT nasal spray   . glucosamine-chondroitin 500-400 MG tablet Take 1 tablet by mouth 2 (two) times daily.  . hyoscyamine (LEVSIN, ANASPAZ) 0.125 MG tablet   . magnesium oxide (MAG-OX) 400 MG tablet Take 800 mg by mouth daily. Pt takes [phone] mg daily for hotflashes.  . Multiple Vitamin (MULTIVITAMIN) tablet Take 1 tablet by mouth daily.  . Omega-3 Fatty Acids (FISH OIL) 1000 MG CAPS Take by mouth.  . tamoxifen (NOLVADEX) 20 MG tablet TAKE 1 TABLET DAILY  . traMADol (ULTRAM) 50 MG tablet Take 100 mg by mouth every 8 (eight) hours as needed.   . venlafaxine (EFFEXOR) 75 MG tablet Take 75 mg by mouth once.  . [DISCONTINUED] azithromycin (ZITHROMAX) 500 MG tablet Take by mouth daily.   No facility-administered encounter medications on file as of 05/23/2016.   :  Review of Systems:  Out of a complete 14 point review of systems, all are reviewed and negative with the exception of these symptoms as listed below: Review of Systems  Neurological:       No new concerns per patient.     Objective:  Neurologic Exam  Physical Exam Physical Examination:   Vitals:   05/23/16 1303  BP: 122/68  Pulse: 72   General Examination: The patient is a very pleasant 65 y.o. female in no acute distress. She appears well-developed and well-nourished and well groomed. She is in good spirits today.  HEENT: Normocephalic, atraumatic, pupils are equal, round and reactive to light and accommodation. She has mild bilateral cataracts. Extraocular tracking is good without limitation to gaze excursion or nystagmus noted. Normal smooth pursuit is noted. Hearing is grossly intact. Face is symmetric with normal facial animation and normal facial sensation. Speech is clear with no dysarthria noted. There is no hypophonia. There is no lip, neck/head, jaw  or voice tremor. Neck is  supple with full range of passive and active motion. There are no carotid bruits on auscultation. Oropharynx exam reveals: mild mouth dryness, adequate dental hygiene and moderate airway crowding, due to narrow airway, tonsillar size of 1+ and elongated uvula. Mallampati is class II. Tongue protrudes centrally and palate elevates symmetrically.   Chest: Clear to auscultation without wheezing, rhonchi or crackles noted.  Heart: S1+S2+0, regular and normal without murmurs, rubs or gallops noted.   Abdomen: Soft, non-tender and non-distended with normal bowel sounds appreciated on auscultation.  Extremities: There is no pitting edema in the distal lower extremities bilaterally. Pedal pulses are intact.  Skin: Warm and dry without trophic changes noted. There are no varicose veins.  Musculoskeletal: exam reveals no obvious joint deformities, tenderness or joint swelling or erythema.   Neurologically:  Mental status: The patient is awake, alert and oriented in all 4 spheres. Her memory, attention, language and knowledge are appropriate. There is no aphasia, agnosia, apraxia or anomia. Speech is clear with normal prosody and enunciation. Thought process is linear. Mood is congruent and affect is normal.  Cranial nerves are as described above under HEENT exam.  Motor exam: Normal bulk, strength and tone is noted. There is no drift, tremor or rebound. Romberg is negative. Reflexes are 1-2+ throughout. Fine motor skills are intact in the UEs and LEs.  Cerebellar testing shows no dysmetria or intention tremor. There is no truncal or gait ataxia.  Sensory exam is intact to light touch.  Gait, station and balance are unremarkable. No veering to one side is noted. No leaning to one side is noted. Posture is age-appropriate and stance is narrow based. No problems turning are noted.  Assessment and Plan:   In summary, LOTUS SANTILLO is a very pleasant 65 year old female with an  underlying medical history of hypertension, lung disease, osteoarthritis, reflux disease, hyperlipidemia, depression, migraines, IBS, breast cancer (diagnosed with left-sided breast cancer in 2015 with status post left-sided lumpectomy on 12/05/2013 and second surgery on 01/01/2014, with hormone treatment and radiation therapy subsequently), and obesity, who presents for FU consultation of her obstructive sleep apnea, on treatment with CPAP with full compliance and ongoing good results. Exam is stable. She is advised to continue to use her CPAP regularly and to work on weight loss. I updated her CPAP supply order. I reviewed the compliance data with the patient and she was commende for her excellent treatment adherence. I would like to see her back in one year. I answered all her questions today and the patient was in agreement. I spent 20 minutes in total face-to-face time with the patient, more than 50% of which was spent in counseling and coordination of care, reviewing test results, reviewing medication and discussing or reviewing the diagnosis of OSA, its prognosis and treatment options.

## 2016-06-06 ENCOUNTER — Ambulatory Visit: Payer: BLUE CROSS/BLUE SHIELD | Admitting: Neurology

## 2016-07-13 ENCOUNTER — Other Ambulatory Visit: Payer: Self-pay | Admitting: Oncology

## 2016-10-05 ENCOUNTER — Other Ambulatory Visit: Payer: Self-pay | Admitting: Oncology

## 2016-10-11 ENCOUNTER — Other Ambulatory Visit: Payer: Self-pay | Admitting: Oncology

## 2016-10-11 DIAGNOSIS — Z853 Personal history of malignant neoplasm of breast: Secondary | ICD-10-CM

## 2016-11-30 ENCOUNTER — Encounter: Payer: Self-pay | Admitting: Neurology

## 2017-01-02 ENCOUNTER — Ambulatory Visit: Payer: BLUE CROSS/BLUE SHIELD | Admitting: Oncology

## 2017-01-02 ENCOUNTER — Other Ambulatory Visit: Payer: BLUE CROSS/BLUE SHIELD

## 2017-01-04 ENCOUNTER — Encounter: Payer: Self-pay | Admitting: Neurology

## 2017-01-10 ENCOUNTER — Telehealth: Payer: Self-pay | Admitting: Neurology

## 2017-01-10 NOTE — Telephone Encounter (Signed)
I reviewed her CPAP compliance data from 12/06/2016 through 01/04/2017, which is a total of 30 days, during which time she used her CPAP every night with percent used days greater than 4 hours at 100%, indicating superb compliance, average usage of 7 hours and 46 minutes, residual AHI low at 0.4 per hour, pressure of 8 cm. Patient is supposed to follow-up later this year for her yearly checkup, no action required.

## 2017-01-11 NOTE — Telephone Encounter (Signed)
Noted  

## 2017-01-20 ENCOUNTER — Ambulatory Visit
Admission: RE | Admit: 2017-01-20 | Discharge: 2017-01-20 | Disposition: A | Discharge status: Home / Self Care | Payer: BLUE CROSS/BLUE SHIELD | Source: Ambulatory Visit | Attending: Oncology | Admitting: Oncology

## 2017-01-20 ENCOUNTER — Other Ambulatory Visit: Payer: Self-pay | Admitting: *Deleted

## 2017-01-20 DIAGNOSIS — Z853 Personal history of malignant neoplasm of breast: Secondary | ICD-10-CM

## 2017-01-20 DIAGNOSIS — C50312 Malignant neoplasm of lower-inner quadrant of left female breast: Secondary | ICD-10-CM

## 2017-01-23 ENCOUNTER — Other Ambulatory Visit (HOSPITAL_BASED_OUTPATIENT_CLINIC_OR_DEPARTMENT_OTHER): Payer: BLUE CROSS/BLUE SHIELD

## 2017-01-23 ENCOUNTER — Ambulatory Visit (HOSPITAL_BASED_OUTPATIENT_CLINIC_OR_DEPARTMENT_OTHER): Payer: BLUE CROSS/BLUE SHIELD | Admitting: Oncology

## 2017-01-23 VITALS — BP 130/71 | HR 76 | Temp 98.2°F | Resp 18 | Ht 64.0 in | Wt 202.2 lb

## 2017-01-23 DIAGNOSIS — Z170 Estrogen receptor positive status [ER+]: Secondary | ICD-10-CM

## 2017-01-23 DIAGNOSIS — C50312 Malignant neoplasm of lower-inner quadrant of left female breast: Secondary | ICD-10-CM

## 2017-01-23 DIAGNOSIS — Z79810 Long term (current) use of selective estrogen receptor modulators (SERMs): Secondary | ICD-10-CM

## 2017-01-23 LAB — COMPREHENSIVE METABOLIC PANEL
ALT: 19 U/L (ref 0–55)
AST: 18 U/L (ref 5–34)
Albumin: 3.4 g/dL — ABNORMAL LOW (ref 3.5–5.0)
Alkaline Phosphatase: 66 U/L (ref 40–150)
Anion Gap: 10 mEq/L (ref 3–11)
BUN: 15.8 mg/dL (ref 7.0–26.0)
CHLORIDE: 107 meq/L (ref 98–109)
CO2: 26 mEq/L (ref 22–29)
CREATININE: 0.8 mg/dL (ref 0.6–1.1)
Calcium: 9.3 mg/dL (ref 8.4–10.4)
EGFR: 75 mL/min/{1.73_m2} — ABNORMAL LOW (ref >90)
GLUCOSE: 108 mg/dL (ref 70–140)
POTASSIUM: 4 meq/L (ref 3.5–5.1)
SODIUM: 142 meq/L (ref 136–145)
Total Bilirubin: 0.22 mg/dL (ref 0.20–1.20)
Total Protein: 6.7 g/dL (ref 6.4–8.3)

## 2017-01-23 LAB — CBC WITH DIFFERENTIAL/PLATELET
BASO%: 0.5 % (ref 0.0–2.0)
Basophils Absolute: 0 10*3/uL (ref 0.0–0.1)
EOS%: 5 % (ref 0.0–7.0)
Eosinophils Absolute: 0.3 10*3/uL (ref 0.0–0.5)
HEMATOCRIT: 38.7 % (ref 34.8–46.6)
HGB: 13 g/dL (ref 11.6–15.9)
LYMPH#: 2.1 10*3/uL (ref 0.9–3.3)
LYMPH%: 31.9 % (ref 14.0–49.7)
MCH: 31.3 pg (ref 25.1–34.0)
MCHC: 33.6 g/dL (ref 31.5–36.0)
MCV: 93.3 fL (ref 79.5–101.0)
MONO#: 0.6 10*3/uL (ref 0.1–0.9)
MONO%: 9.4 % (ref 0.0–14.0)
NEUT#: 3.5 10*3/uL (ref 1.5–6.5)
NEUT%: 53.2 % (ref 38.4–76.8)
Platelets: 199 10*3/uL (ref 145–400)
RBC: 4.15 10*6/uL (ref 3.70–5.45)
RDW: 13 % (ref 11.2–14.5)
WBC: 6.6 10*3/uL (ref 3.9–10.3)

## 2017-01-23 NOTE — Progress Notes (Signed)
Connie [name]  Telephone:(336) [phone] Fax:(336) [phone]     ID: Connie [name] DOB: [birth year]  MR#: [id number]  URK#:[id number]  PCP: Marton Redwood, MD GYN: [name] SU: [name] OTHER MD: [name]. [name]  CHIEF COMPLAINT: Estrogen receptor positive breast cancer  CURRENT TREATMENT:  tamoxifen   BREAST CANCER HISTORY: From the original intake note:  The patient herself palpated a mass in her left breast around Christmastime 2014 but was about to undergo knee surgery at the time and did not immediately followup on this. On 11/06/2013 she brought it to Dr. [name] attention and he scheduled her for bilateral diagnostic mammography and left ultrasonography at the breast Center. This showed a 2.4 cm mass in the left lower inner quadrant, with lobulated margins. This was palpable and appeared fixed. Ultrasound confirmed an irregular hypoechoic solid mass at the 8:30 position 5 cm from the nipple measuring 2.1 cm. Medial to this was an oval hypoechoic mass measuring 0.7 cm and inferior to this there were at least 2 additional hypoechoic nodules, the largest measuring 0.8 cm. Ultrasound of the left axilla was negative.  Ultrasound-guided biopsy of the 2 more prominent left breast masses 11/13/2013 showed ([id number]) one of the masses to be benign. The other one was an invasive ductal carcinoma, grade 1 estrogen receptor 100% positive, progesterone receptor 91% positive, both with strong staining intensity, with an MIB-1 of 89%, and no HER-2 amplification, the signals ratio being 0.93 and the number per cell 1.95.  On 11/21/2013 the patient underwent bilateral breast MRI. This showed, in the 8:00 position of the left breast, a 2.6 cm enhancing mass with irregular borders. Lateral to this there was a second biopsy marker clip surrounded by minimal enhancement. There was no mass in that area. Ultrasound had shown evidence of possible satellite masses inferior  to the dominant mass but these were not seen by MRI. There were no other findings, the right breast was normal, and the left axilla showed no abnormal notes.  On 12/05/2013 the patient underwent left lumpectomy and sentinel lymph node sampling. This showed a 2.9 cm invasive ductal carcinoma, grade 3, less than a millimeter from the closest margin. 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 Estrogen receptor and progesterone receptor studies were obtained on the DCIS and are positive at 100% for the estrogen receptor and 39%, progesterone receptor, both with strong staining intensity.  On 01/01/2014 the patient underwent additional surgery for margin clearance. This showed no residual tumor.  In addition, an Oncotype scan was obtained from the invasive tumor (lumpectomy specimen) and showed a score of 15, predicting a ten-year risk of outside the breast recurrence of 10% if the patient's only systemic treatment was tamoxifen for 5 years. It also predicted no benefit from chemotherapy.  The patient was evaluated by Dr. Pablo Ledger and started radiation treatments 02/11/2014. She also met with genetics, since there was a documented BRCA 2 mutation in a first cousin. The patient was tested specifically for this mutation, and was found to be negative.  Her subsequent history is as detailed below  INTERVAL HISTORY: Connie [name] returns today for follow-up and treatment of her estrogen receptor positive breast cancer. She continues on tamoxifen, with good tolerance. Hot flashes are mild. They're more like a sensation of feeling hot in general and it does make her intolerant of summer here. She doesn't have significant problems with vaginal wetness. She obtains a drug at a very good price.  REVIEW OF SYSTEMS: They are wintering in [place] which she enjoys. However while there she developed a viral pneumonia which put her in the hospital for several days. She has treated with  intravenous antibiotics. She continues to have significant knee problems and her insurance has decided that the cockscomb injections are experimental so they're not paying for it. She is very upset about this but looking forward to turning 66 later this year. She believes that would take care of that problem. Because of the traveling in the knee issues she is not exercising regularly. Aside from these issues a detailed review of systems today was stable  PAST MEDICAL HISTORY: Past Medical History:  Diagnosis Date  . Anxiety   . Arthritis    L FOOT  . Breast cancer ([place]) 11/13/13   left breast invasive ductal ca,  . Bronchitis, chronic ([place])   . Cancer [facility])    left breast cancer  . Depression   . Diverticulitis   . GERD (gastroesophageal reflux disease)   . Headache(784.0)    MIGRAINES  . High cholesterol   . History of shingles Feb. 2015  . IBS (irritable bowel syndrome)   . Meniscus tear    LEFT x 2  . OSA (obstructive sleep apnea) 04/30/2013   USES C -PAP  . PLMD (periodic limb movement disorder) 04/30/2013  . Pneumonia   . PVC's (premature ventricular contractions) 04/30/2013  . Radiation 02/06/14-03/24/14   Left breast ductal cancer  . Wears glasses     PAST SURGICAL HISTORY: Past Surgical History:  Procedure Laterality Date  . APPENDECTOMY  1984  . BREAST LUMPECTOMY Left 12/05/2013   Procedure: BREAST LUMPECTOMY WITH EXCISION OF SENTINEL NODE;  Surgeon: [name], MD;  Location: [facility];  Service: General;  Laterality: Left;  . DILATION AND CURETTAGE OF UTERUS    . KNEE ARTHROSCOPY Left 08/28/2013   Procedure: LEFT KNEE ARTHROSCOPY WITH MEDIAL AND LATERAL DEBRIDEMENT;  Surgeon: Gearlean Alf, MD;  Location: [facility];  Service: Orthopedics;  Laterality: Left;  . KNEE SURGERY Left 2000-2001    RT KNEE X1  . OSTEOTOMY     RT KNEE  . RE-EXCISION OF BREAST LUMPECTOMY Left 01/01/2014   Procedure: RE-EXCISION OF left BREAST cancer ;  Surgeon: [name], MD;  Location: [facility];  Service: General;  Laterality: Left;    FAMILY HISTORY Family History  Problem Relation Age of Onset  . Cancer Father 78       esophageal and prostate cancer - smoker  . Learning disabilities Mother   . Cancer Paternal Aunt        breast at unknown age  . Cancer Cousin 78       Breast cancer in a paternal first cousin; BRCA2 + , IVS5-2A>G. Tested at [facility]  . Cancer Cousin 28       breast cancer in a paternal first cousin (sister to the cousin with the BRCA2 mutation)   the patient's father died from esophageal cancer at age 74. The patient's mother is living, at age 24. The patient had one brother, 2 sisters. One of the patient's father's 2 sisters was diagnosed with breast cancer and that aunt has 3 daughters, one of whom had breast cancer at an early age. The daughter was tested and was found to carry a BRCA2 mutation which Jakyria however does not carry.  GYNECOLOGIC HISTORY:  No LMP recorded. Patient is postmenopausal. Menarche age 31, first live birth age 54, which the  patient understands increases the risk of breast cancer. She stopped having periods approximately 10 years ago and has been on hormone replacement since that time, stopping in April of 2015.  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 Her husband Frederico Hamman worked as an Clinical biochemist. He also recently retired. This is his second marriage for both of them. She has 2 children from her first marriage, Gaylord Shih, who works in [employer] in [place], and [employer], who works in Pharmacologist and lives in [place]. Frederico Hamman has 2 children, Anderson Malta lives in [place] and [place] who lives in [place]. Anderson Malta is a stay-at-home mom Wendi Maya is a Education officer, museum. They have one grandson, who lives in [place]: In place   HEALTH MAINTENANCE: Social History  Substance  Use Topics  . Smoking status: Never Smoker  . Smokeless tobacco: Never Used  . Alcohol use Yes     Comment: SOCIALLY     Colonoscopy: 2009, under lab our. She also had EGD at the same time given her father's history of esophageal cancer  PAP: 2014  Bone density: 2014 at [facility]; lowest T score was -0.9 (normal)  Lipid panel:  No Known Allergies  Current Outpatient Prescriptions  Medication Sig Dispense Refill  . aspirin 81 MG tablet Take 81 mg by mouth daily.    Marland Kitchen atenolol (TENORMIN) 25 MG tablet Take 25 mg by mouth every morning.     Marland Kitchen atorvastatin (LIPITOR) 40 MG tablet Take 40 mg by mouth every evening.     Marland Kitchen b complex vitamins tablet Take 1 tablet by mouth daily.    . butalbital-aspirin-caffeine (FIORINAL) 50-325-40 MG per capsule Take 1-2 capsules by mouth 2 (two) times daily as needed for headache.     . cloNIDine (CATAPRES - DOSED IN MG/24 HR) 0.1 mg/24hr patch Place 1 patch (0.1 mg total) onto the skin once a week. 4 patch 0  . cloNIDine (CATAPRES - DOSED IN MG/24 HR) 0.1 mg/24hr patch PLACE 1 PATCH ON THE SKIN ONCE A WEEK 12 patch 1  . cyclobenzaprine (FLEXERIL) 10 MG tablet Take 10 mg by mouth daily as needed for muscle spasms.    . diphenhydrAMINE (BENADRYL) 25 MG tablet Take 50 mg by mouth at bedtime.    . fluticasone (FLONASE) 50 MCG/ACT nasal spray     . glucosamine-chondroitin 500-400 MG tablet Take 1 tablet by mouth 2 (two) times daily.    . hyoscyamine (LEVSIN, ANASPAZ) 0.125 MG tablet     . magnesium oxide (MAG-OX) 400 MG tablet Take 800 mg by mouth daily. Pt takes [phone] mg daily for hotflashes.    . Multiple Vitamin (MULTIVITAMIN) tablet Take 1 tablet by mouth daily.    . Omega-3 Fatty Acids (FISH OIL) 1000 MG CAPS Take by mouth.    . tamoxifen (NOLVADEX) 20 MG tablet TAKE 1 TABLET DAILY 90 tablet 11  . traMADol (ULTRAM) 50 MG tablet Take 100 mg by mouth every 8 (eight) hours as needed.     . venlafaxine (EFFEXOR) 75 MG tablet Take 75 mg by mouth  once.     No current facility-administered medications for this visit.     OBJECTIVE: Middle-aged white womanIn no acute distress  Vitals:   01/23/17 1414  BP: 130/71  Pulse: 76  Resp: 18  Temp: 98.2 F (36.8 C)     Body mass index is 34.[place]  kg/m.    ECOG FS:1 - Symptomatic but completely ambulatory  Sclerae unicteric, EOMs intact Oropharynx clear and moist No cervical or supraclavicular adenopathy Lungs no rales or rhonchi Heart regular rate and rhythm Abd soft, nontender, positive bowel sounds MSK no focal spinal tenderness, no upper extremity lymphedema Neuro: nonfocal, well oriented, appropriate affect Breasts: The right breast is benign. The left breast has undergone lumpectomy followed by radiation with no evidence of local recurrence. Both axillae are benign.  LAB RESULTS:  CMP     Component Value Date/Time   NA 142 01/23/2017 1348   K 4.0 01/23/2017 1348   CL 103 12/03/2013 0939   CO2 26 01/23/2017 1348   GLUCOSE 108 01/23/2017 1348   BUN 15.8 01/23/2017 1348   CREATININE 0.8 01/23/2017 1348   CALCIUM 9.3 01/23/2017 1348   PROT 6.7 01/23/2017 1348   ALBUMIN 3.4 (L) 01/23/2017 1348   AST 18 01/23/2017 1348   ALT 19 01/23/2017 1348   ALKPHOS 66 01/23/2017 1348   BILITOT 0.22 01/23/2017 1348   GFRNONAA 88 (L) 12/03/2013 0939   GFRAA >90 12/03/2013 0939    I No results found for: SPEP  Lab Results  Component Value Date   WBC 6.6 01/23/2017   NEUTROABS 3.5 01/23/2017   HGB 13.0 01/23/2017   HCT 38.7 01/23/2017   MCV 93.3 01/23/2017   PLT 199 01/23/2017      Chemistry      Component Value Date/Time   NA 142 01/23/2017 1348   K 4.0 01/23/2017 1348   CL 103 12/03/2013 0939   CO2 26 01/23/2017 1348   BUN 15.8 01/23/2017 1348   CREATININE 0.8 01/23/2017 1348      Component Value Date/Time   CALCIUM 9.3 01/23/2017 1348   ALKPHOS 66 01/23/2017 1348   AST 18 01/23/2017 1348   ALT 19 01/23/2017 1348   BILITOT 0.22 01/23/2017 1348       No  results found for: LABCA2  No components found for: LABCA125  No results for input(s): INR in the last 168 hours.  Urinalysis    Component Value Date/Time   COLORURINE YELLOW 12/15/2008 0701   APPEARANCEUR HAZY (A) 12/15/2008 0701   LABSPEC 1.031 (H) 12/15/2008 0701   PHURINE 5.5 12/15/2008 0701   GLUCOSEU NEGATIVE 12/15/2008 0701   HGBUR TRACE (A) 12/15/2008 0701   BILIRUBINUR NEGATIVE 12/15/2008 0701   KETONESUR NEGATIVE 12/15/2008 0701   PROTEINUR NEGATIVE 12/15/2008 0701   UROBILINOGEN 0.2 12/15/2008 0701   NITRITE NEGATIVE 12/15/2008 0701   LEUKOCYTESUR NEGATIVE 12/15/2008 0701    STUDIES: Mm Diag Breast Tomo Bilateral  Result Date: 01/20/2017 CLINICAL DATA:  Malignant lumpectomy of the lower inner quadrant of the left breast in April, 2015, pathology invasive ductal carcinoma.Adjuvant radiation therapy. Currently undergoing hormonal chemoprevention with tamoxifen. Annual evaluation. EXAM: 2D DIGITAL DIAGNOSTIC BILATERAL MAMMOGRAM WITH CAD AND ADJUNCT TOMO COMPARISON:  12/31/2015, 11/10/2014 and earlier. ACR Breast Density Category c: The breast tissue is heterogeneously dense, which may obscure small masses. FINDINGS: Standard 2D and tomosynthesis full field CC and MLO views of both breasts were obtained. A standard spot magnification CC view of the lumpectomy site in the left breast was also obtained. Post surgical scar/architectural distortion involving the lower inner left breast at anterior and middle depth related to the prior lumpectomy. Similar scar in the left axilla at the site of prior node removal. No new or suspicious findings in the left breast. No findings suspicious for malignancy in the right breast. Mammographic images  were processed with CAD. IMPRESSION: No mammographic evidence of malignancy involving either breast. Expected post lumpectomy changes involving the left breast. RECOMMENDATION: Bilateral diagnostic mammography in 1 year. I have discussed the findings  and recommendations with the patient. Results were also provided in writing at the conclusion of the visit. If applicable, a reminder letter will be sent to the patient regarding the next appointment. BI-RADS CATEGORY  2: Benign. Electronically Signed   By: Evangeline Dakin M.D.   On: 01/20/2017 15:21     ASSESSMENT: 66 y.o. [place] woman s/p left breast biopsy 11/13/2013 for a clinical T2 N0, stage IIA invasive ductal carcinoma, grade 1, estrogen and progesterone receptor positive, HER-2 negative, with an MIB-1 of 89%  (1) status post left lumpectomy and sentinel lymph node sampling 12/05/2013 for a pT2 pN0, stage IIA invasive ductal carcinoma, grade 2, together with ductal carcinoma in situ, which was also estrogen and progesterone receptor positive margins were involved  (2) status post additional surgery 01/01/2014 for margin clearance, showing no residual tumor  (3) Oncotype score of 15 predicted in a 10 year outside the breast risk of recurrence of 10% if the patient's only systemic treatment was tamoxifen for 5 years. It also predicted no benefit from chemotherapy  (4) adjuvant radiation completed 03/24/2014  (5) tamoxifen started 04/11/2014  (6) the patient was tested for a BRCA2 mutation documented in other family members, [id number] (IVS5-2A>G). She does not carry this mutation   PLAN: Jaileen is now 3 years out from definitive surgery for her breast cancer with no evidence of disease recurrence. This is very favorable.  She is tolerating tamoxifen well. We again discussed the possibility of switching to an aromatase inhibitor, but she likes to be on tamoxifen particularly since it does not "take away her estrogen".  Her medical care is complicated by their travels: They live part of the year in their mobile home and they stay well winter and part of the summer. She would normally see me again in July of next year after her June mammogram, which she would like to move the  mammogram and a visit to October. 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     Chauncey Cruel, MD   01/23/2017 2:52 PM

## 2017-01-25 ENCOUNTER — Telehealth: Payer: Self-pay

## 2017-01-25 NOTE — Telephone Encounter (Signed)
I called pt and reminded her to bring her cpap chip to her appt with Dr. Rexene Alberts on Monday. Pt verbalized understanding.

## 2017-01-30 ENCOUNTER — Ambulatory Visit (INDEPENDENT_AMBULATORY_CARE_PROVIDER_SITE_OTHER): Payer: BLUE CROSS/BLUE SHIELD | Admitting: Neurology

## 2017-01-30 ENCOUNTER — Encounter: Payer: Self-pay | Admitting: Neurology

## 2017-01-30 VITALS — BP 125/83 | HR 69 | Ht 64.0 in | Wt 203.0 lb

## 2017-01-30 DIAGNOSIS — Z9989 Dependence on other enabling machines and devices: Secondary | ICD-10-CM

## 2017-01-30 DIAGNOSIS — G4733 Obstructive sleep apnea (adult) (pediatric): Secondary | ICD-10-CM

## 2017-01-30 NOTE — Progress Notes (Signed)
Subjective:    [name] ID: Connie [name] is a 67 y.o. female.  HPI     Interim history:   Connie [name] is a very pleasant 66 year old right-handed woman with an underlying medical history of hypertension, lung disease, osteoarthritis, reflux disease, hyperlipidemia, depression, migraines, IBS, breast cancer (diagnosed with left-sided breast cancer in 2015 with status post left-sided lumpectomy on 12/05/2013 and second surgery on 01/01/2014, with hormone treatment and radiation therapy subsequently), and obesity, who presents for FU consultation of Connie [name] obstructive sleep apnea, on treatment with CPAP. She is unaccompanied today. I last saw Connie [name] on 05/23/16, at which time she reported doing okay, she was fully compliant with CPAP. She was traveling with Connie [name] husband in their motor home. She was advised to follow-up in one year. She called in Connie interim requesting to move up Connie appointment to late June or early July because of Connie [name] travel schedule.  Today, 01/30/2017 (all dictated new, as well as above notes, some dictation done in note pad or Word, outside of chart, may appear as copied):   I reviewed Connie [name] CPAP compliance data from 12/31/2016 through 01/29/2017, which is a total of 30 days, during which time she used Connie [name] CPAP every night with percent used days greater than 4 hours at 100%, indicating superb compliance with an average usage of 7 hours and 48 minutes, residual AHI at goal at store 0.6 per hour, leaked low, pressure at 8 cm. She reports doing well, no recent medical issues, no recent medication changes, does need CPAP related supplies, DME is [facility]. She had pneumonia when they were in [place] in February 2018 and was hospitalized for this for 4-5 days even, was given IV antibiotics. Sadly, Connie [name] mother passed away in March 6160, complications from Connie flu. They travel throughout Connie year in their motor home. They will be driving up [place] to spend time with Connie [name] son. She has 2  stepdaughters in [place] and Connie [name] daughter lives here locally.  Connie [name]'s allergies, current medications, family history, past medical history, past social history, past surgical history and problem list were reviewed and updated as appropriate.   Previously (copied from previous notes for reference):   I saw Connie [name] on 05/11/2015, at which time she reported doing well with CPAP. She needed to switch DME providers. She had additional stressors, as Connie [name] mother was diagnosed with Lewy body dementia, but thankfully Connie [name] 2 sisters and 1 brother in [place] were able to help out and see Connie [name] mom regularly. She also had knee surgery in 2015 with residual knee pain reported. She also developed shingles on Connie left side of Connie [name] thorax in 2015. 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    I reviewed Connie [name] CPAP compliance data from 04/23/2016 through 05/22/2016, which is a total of 30 days, during which time she used Connie [name] machine every day with percent used days greater than 4 hours at 100%, indicating superb compliance with an average usage of 7 hours and 40 minutes, residual AHI 0.8 per hour, leak low, pressure at 8 cm.     I saw Connie [name] on 06/10/2013, at which time she reported feeling better and tolerating CPAP. She was compliant with treatment and felt, it was helpful.   I reviewed Connie [name] CPAP compliance data from 04/11/2015 through 05/10/2015 which is a total of 30 days during which time she used Connie [name] machine every night with percent used days greater than 4 hours at 100%, indicating superb compliance with an average usage  of 8 hours and 1 minute, residual AHI low at 1.2 per hour, leak very low with an average time of 8 seconds of leak per day, pressure at 8 cm.   I saw Connie [name] on 04/30/2013, which time we discussed Connie [name] sleep study findings and I suggested a trial of CPAP in Connie form of AutoPap. She was agreeable to this and I wanted to see Connie [name] back after about a month of trying CPAP. I also reviewed compliance data in Connie  interim from 05/03/2013 to 05/25/2013 which is a total of 23 days during which time she uses CPAP every day. Percent used days greater than 4 hours was 100%. Connie [name] CPAP mean pressure was 6.8, Connie 90th percentile was 8.2 cm. Connie [name] residual AHI was 0.7 per hour. Connie [name] average usage for all days was 7 hours and 38 minutes. This indicates excellent compliance. Also, based on Connie pressure summary I changed Connie [name] CPAP setting to a set pressure of 8 cm. She uses a nasal pillows and tolerates it well. She sleeps better and feels better rested. She had no changes in Connie [name] medical Hx of medications.   Connie [name] pressure will be adjusted in 2 days from now. She has an appointment with Connie [name] DME company. I first met Connie [name] on 03/06/2013, which time she reported daytime somnolence and snoring. 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 Based on Connie [name] history and exam I felt she had findings concerning for OSA and asked Connie [name] to come back for sleep study. She had a diagnostic polysomnogram on 03/08/2013 and I went over Connie [name] test results with Connie [name] last time. Sleep efficiency was normal at 91.1% with a latency to sleep of 17 minutes. Wake after sleep onset was 27 minutes with moderate to severe sleep fragmentation noted. She had an increased percentage of stage I and 2 sleep, near absence of slow-wave sleep and a decreased percentage of REM sleep at 10.7% with a highly prolonged REM latency of 245 minutes. She had mild periodic leg movements at 18.6 per hour resulting in very mild arousals of 3.3 per hour. She had occasional PVCs. She had mild snoring. She had a total of 39 obstructive hypopneas, rendering a borderline elevated AHI of 5.2 events per hour, with further elevation to 19.8 per hour in REM sleep. Connie [name] baseline oxygen saturation was 94%, Connie [name] nadir was 85% and she spent 13 minutes and 8 seconds below Connie saturation of 90%.   She denies any recent chest pain or shortness of breath or palpitations. Connie [name] primary care physician checks  Connie [name] EKG regularly. She has no symptoms from Connie [name] occasional PVCs that we saw on Connie sleep study.  Connie [name] Past Medical History Is Significant For: Past Medical History:  Diagnosis Date  . Anxiety   . Arthritis    L FOOT  . Breast cancer ([place]) 11/13/13   left breast invasive ductal ca,  . Bronchitis, chronic ([place])   . Cancer [facility])    left breast cancer  . Depression   . Diverticulitis   . GERD (gastroesophageal reflux disease)   . Headache(784.0)    MIGRAINES  . High cholesterol   . History of shingles Feb. 2015  . IBS (irritable bowel syndrome)   . Meniscus tear    LEFT x 2  . OSA (obstructive sleep apnea) 04/30/2013   USES C -PAP  . PLMD (periodic limb movement disorder) 04/30/2013  . Pneumonia   . PVC's (premature ventricular contractions) 04/30/2013  .  Radiation 02/06/14-03/24/14   Left breast ductal cancer  . Wears glasses     Connie [name] Past Surgical History Is Significant For: Past Surgical History:  Procedure Laterality Date  . APPENDECTOMY  1984  . BREAST LUMPECTOMY Left 12/05/2013   Procedure: BREAST LUMPECTOMY WITH EXCISION OF SENTINEL NODE;  Surgeon: [name], MD;  Location: [place];  Service: General;  Laterality: Left;  . DILATION AND CURETTAGE OF UTERUS    . KNEE ARTHROSCOPY Left 08/28/2013   Procedure: LEFT KNEE ARTHROSCOPY WITH MEDIAL AND LATERAL DEBRIDEMENT;  Surgeon: Gearlean Alf, MD;  Location: [facility];  Service: Orthopedics;  Laterality: Left;  . KNEE SURGERY Left 2000-2001    RT KNEE X1  . OSTEOTOMY     RT KNEE  . RE-EXCISION OF BREAST LUMPECTOMY Left 01/01/2014   Procedure: RE-EXCISION OF left BREAST cancer ;  Surgeon: [name], MD;  Location: [place];  Service: General;  Laterality: Left;    Connie [name] Family History Is Significant For: Family History  Problem Relation Age of Onset  . Cancer Father 68       esophageal and prostate cancer - smoker  . Learning disabilities Mother   . Cancer Paternal Aunt        breast at unknown age  . Cancer  Cousin 22       Breast cancer in a paternal first cousin; BRCA2 + , IVS5-2A>G. Tested at [facility]  . Cancer Cousin 60       breast cancer in a paternal first cousin (sister to Connie cousin with Connie BRCA2 mutation)    Connie [name] Social History Is Significant For: Social History   Social History  . Marital status: Married    Spouse name: N/A  . Number of children: 4  . Years of education: college   Occupational History  . [employer]    Social History Main Topics  . Smoking status: Never Smoker  . Smokeless tobacco: Never Used  . Alcohol use Yes     Comment: SOCIALLY  . Drug use: No  . Sexual activity: Not Asked   Other Topics Concern  . None   Social History Narrative  . None    Connie [name] Allergies Are:  No Known Allergies:   Connie [name] Current Medications Are:  Outpatient Encounter Prescriptions as of 01/30/2017  Medication Sig  . aspirin 81 MG tablet Take 81 mg by mouth daily.  Marland Kitchen atenolol (TENORMIN) 25 MG tablet Take 25 mg by mouth every morning.   Marland Kitchen atorvastatin (LIPITOR) 40 MG tablet Take 40 mg by mouth every evening.   Marland Kitchen b complex vitamins tablet Take 1 tablet by mouth daily.  . butalbital-aspirin-caffeine (FIORINAL) 50-325-40 MG per capsule Take 1-2 capsules by mouth 2 (two) times daily as needed for headache.   . cloNIDine (CATAPRES - DOSED IN MG/24 HR) 0.1 mg/24hr patch PLACE 1 PATCH ON Connie SKIN ONCE A WEEK  . cyclobenzaprine (FLEXERIL) 10 MG tablet Take 10 mg by mouth daily as needed for muscle spasms.  . diphenhydrAMINE (BENADRYL) 25 MG tablet Take 50 mg by mouth at bedtime.  . fluticasone (FLONASE) 50 MCG/ACT nasal spray   . glucosamine-chondroitin 500-400 MG tablet Take 1 tablet by mouth 2 (two) times daily.  . hyoscyamine (LEVSIN, ANASPAZ) 0.125 MG tablet   . magnesium oxide (MAG-OX) 400 MG tablet Take 800 mg by mouth daily. Pt takes [phone] mg daily for hotflashes.  . Multiple Vitamin (MULTIVITAMIN) tablet Take 1 tablet by mouth daily.  . Omega-3  Fatty Acids (FISH  OIL) 1000 MG CAPS Take by mouth.  . tamoxifen (NOLVADEX) 20 MG tablet TAKE 1 TABLET DAILY  . traMADol (ULTRAM) 50 MG tablet Take 100 mg by mouth every 8 (eight) hours as needed.   . venlafaxine (EFFEXOR) 75 MG tablet Take 75 mg by mouth once.  . [DISCONTINUED] cloNIDine (CATAPRES - DOSED IN MG/24 HR) 0.1 mg/24hr patch Place 1 patch (0.1 mg total) onto Connie skin once a week.   No facility-administered encounter medications on file as of 01/30/2017.   :  Review of Systems:  Out of a complete 14 point review of systems, all are reviewed and negative with Connie exception of these symptoms as listed below: Review of Systems  Neurological:       Pt presents today to follow up on Connie [name] cpap. Pt needs an order for Connie [name] supplies to be sent to [facility] annually.    Objective:  Neurological Exam  Physical Exam Physical Examination:   Vitals:   01/30/17 1422  BP: 125/83  Pulse: 69   General Examination: Connie [name] is a very pleasant 66 y.o. female in no acute distress. She appears well-developed and well-nourished and well groomed.   HEENT: Normocephalic, atraumatic, pupils are equal, round and reactive to light and accommodation. She has mild bilateral cataracts. Extraocular tracking is good without limitation to gaze excursion or nystagmus noted. Normal smooth pursuit is noted. Hearing is grossly intact. Face is symmetric with normal facial animation and normal facial sensation. Speech is clear with no dysarthria noted. There is no hypophonia. There is no lip, neck/head, jaw or voice tremor. Neck is supple with full range of passive and active motion. There are no carotid bruits on auscultation. Oropharynx exam reveals: mild mouth dryness, adequate dental hygiene and moderate airway crowding, due to narrow airway, tonsillar size of 1+ and elongated uvula. Mallampati is class II. Tongue protrudes centrally and palate elevates symmetrically.   Chest: Clear to auscultation without wheezing, rhonchi or  crackles noted.  Heart: S1+S2+0, regular and normal without murmurs, rubs or gallops noted.   Abdomen: Soft, non-tender and non-distended with normal bowel sounds appreciated on auscultation.  Extremities: There is no pitting edema in Connie distal lower extremities bilaterally. Pedal pulses are intact.  Skin: Warm and dry without trophic changes noted. There are no varicose veins.  Musculoskeletal: exam reveals no obvious joint deformities, tenderness or joint swelling or erythema, unremarkable scar right knee, arthroscopic scars left knee.   Neurologically:  Mental status: Connie [name] is awake, alert and oriented in all 4 spheres. Connie [name] memory, attention, language and knowledge are appropriate. There is no aphasia, agnosia, apraxia or anomia. Speech is clear with normal prosody and enunciation. Thought process is linear. Mood is congruent and affect is normal.  Cranial nerves are as described above under HEENT exam.  Motor exam: Normal bulk, strength and tone is noted. There is no drift, tremor or rebound. Romberg is negative. Reflexes are 1-2+ throughout. Fine motor skills are intact in Connie UEs and LEs.  Cerebellar testing shows no dysmetria or intention tremor. There is no truncal or gait ataxia.  Sensory exam is intact to light touch.  Gait, station and balance are unremarkable. No veering to one side is noted. No leaning to one side is noted. Posture is age-appropriate and stance is narrow based. No problems turning are noted.  Assessment and Plan:   In summary, Connie [name] is a very pleasant 66 year old female with an underlying medical history of hypertension,  lung disease, osteoarthritis, reflux disease, hyperlipidemia, depression, migraines, IBS, breast cancer (diagnosed with left-sided breast cancer in 2015 with status post left-sided lumpectomy on 12/05/2013 and second surgery on 01/01/2014, with hormone treatment and radiation therapy subsequently), Pneumonia in February  2018, and obesity, who presents for FU consultation of Connie [name] obstructive sleep apnea, on treatment with CPAP with full compliance and ongoing good results. Exam is stable. She is advised to continue to use Connie [name] CPAP regularly.  anI placed an updated order for CPAP supplies. She is under percent compliant with Connie [name] CPAP machine and is commended for this. I suggested a one-year checkup routinely, they will be most likely in this area in October 2019 and we can see Connie [name] then. I advised Connie [name] to call with any interim questions or concerns.  I spent 15 minutes in total face-to-face time with Connie [name], more than 50% of which was spent in counseling and coordination of care, reviewing test results, reviewing medication and discussing or reviewing Connie diagnosis of OSA, its prognosis and treatment options. Pertinent laboratory and imaging test results that were available during this visit with Connie [name] were reviewed by me and considered in my medical decision making (see chart for details).

## 2017-01-30 NOTE — Patient Instructions (Signed)
Please continue using your CPAP regularly. While your insurance requires that you use CPAP at least 4 hours each night on 70% of the nights, I recommend, that you not skip any nights and use it throughout the night if you can. Getting used to CPAP and staying with the treatment long term does take time and patience and discipline. Untreated obstructive sleep apnea when it is moderate to severe can have an adverse impact on cardiovascular health and raise her risk for heart disease, arrhythmias, hypertension, congestive heart failure, stroke and diabetes. Untreated obstructive sleep apnea causes sleep disruption, nonrestorative sleep, and sleep deprivation. This can have an impact on your day to day functioning and cause daytime sleepiness and impairment of cognitive function, memory loss, mood disturbance, and problems focussing. Using CPAP regularly can improve these symptoms.  Keep up the good work! We can see you next year in the fall.

## 2017-03-22 ENCOUNTER — Other Ambulatory Visit: Payer: Self-pay | Admitting: Oncology

## 2017-05-10 NOTE — Telephone Encounter (Signed)
Pt has called re: this appointment back in June of this year.  Now that she is on Medicaid she has been told that she needs to see Dr Rexene Alberts before she is able to order her CPAP supplies.  Pt is asking for a call back to confirm if that is accurate or not .  Pt is asking for a call as soon as RN Cyril Mourning is available

## 2017-05-11 NOTE — Telephone Encounter (Signed)
I called pt. I advised her that Medicare is requiring another office visit showing benefit of pap therapy and current usage to get her supplies. Pt is agreeable to an appt on 05/12/2017 at 11:00am with Hoyle Sauer, NP. Pt knows to bring her chip and arrive 15 mins prior to her appt. Pt verbalized understanding of appt date and time.

## 2017-05-11 NOTE — Telephone Encounter (Signed)
Received this notice from [facility]: "Connie [name],   So sorry for the delay on this. I was checking with several groups to confirm. Pt informed us that she is newly effective with Medicare and they are now her primary payer (as of August).   Unfortunately Medicare requires an office visit showing benefit of pap therapy and current usage after the pt's effective date with Medicare for them to approve re-supply :(   Sorry in order to stay within Medicare guidelines the pt will need a new OV to discuss this. Thanks."

## 2017-05-12 ENCOUNTER — Ambulatory Visit: Payer: Self-pay | Admitting: Nurse Practitioner

## 2017-05-15 ENCOUNTER — Telehealth: Payer: Self-pay | Admitting: *Deleted

## 2017-05-15 ENCOUNTER — Encounter: Payer: Self-pay | Admitting: Nurse Practitioner

## 2017-05-15 NOTE — Progress Notes (Addendum)
[facility]  PATIENT: Connie [name] DOB: Jun 13, 1951   REASON FOR VISIT:  Follow-up for obstructive sleep apnea with CPAP,  New  to Medicare which requires face-to-face , HISTORY FROM: patient    HISTORY OF PRESENT ILLNESS:UPDATE  10/16/2018CM  Connie [name],  66 year old female returns for follow-up history of obstructive sleep apnea with CPAP. Patient is new  to Medicare which requires face-to-face.  CPAP compliance data dated 916 2018- 05/15/2017 she was 100% compliance for 30 days  Greater than 4 hours  Which is excellent. Average usage 8 hours 5 minutes 58 seconds.  AHI 0.4 pressure set at 8 cm.  She reports she is doing well.  ESS 4. DM DME  is advanced home care she returns for reevaluation.       SAToday, 01/30/2017 (all dictated new, as well as above notes, some dictation done in note pad or Word, outside of chart, may appear as copied):  I reviewed her CPAP compliance data from 12/31/2016 through 01/29/2017, which is a total of 30 days, during which time she used her CPAP every night with percent used days greater than 4 hours at 100%, indicating superb compliance with an average usage of 7 hours and 48 minutes, residual AHI at goal at store 0.6 per hour, leaked low, pressure at 8 cm. She reports doing well, no recent medical issues, no recent medication changes, does need CPAP related supplies, DME is advanced home care. She had pneumonia when they were in [place] in February 2018 and was hospitalized for this for 4-5 days even, was given IV antibiotics. Sadly, her mother passed away in March 6468, complications from the flu. They travel throughout the year in their motor home. They will be driving up [place] to spend time with her son. She has 2 stepdaughters in [place] and her daughter lives here locally.    REVIEW OF SYSTEMS: Full 14 system review of systems performed and notable only for those listed, all others are neg:  Constitutional: neg    Cardiovascular: neg Ear/Nose/Throat: neg  Skin: neg Eyes: neg Respiratory: neg Gastroitestinal: neg  Hematology/Lymphatic: neg  Endocrine: neg Musculoskeletal:neg Allergy/Immunology:  environental alrges Neurological: neg Psychiatric: neg Sleep :  Obstructive sleep apnea with CPAP   ALLERGIES: No Known Allergies  HOME MEDICATIONS: Outpatient Medications Prior to Visit  Medication Sig Dispense Refill  . aspirin 81 MG tablet Take 81 mg by mouth daily.    Marland Kitchen atenolol (TENORMIN) 25 MG tablet Take 25 mg by mouth every morning.     Marland Kitchen atorvastatin (LIPITOR) 40 MG tablet Take 40 mg by mouth every evening.     Marland Kitchen b complex vitamins tablet Take 1 tablet by mouth daily.    . butalbital-aspirin-caffeine (FIORINAL) 50-325-40 MG per capsule Take 1-2 capsules by mouth 2 (two) times daily as needed for headache.     . cloNIDine (CATAPRES - DOSED IN MG/24 HR) 0.1 mg/24hr patch PLACE 1 PATCH ON THE SKIN ONCE A WEEK 12 patch 1  . cyclobenzaprine (FLEXERIL) 10 MG tablet Take 10 mg by mouth daily as needed for muscle spasms.    . diphenhydrAMINE (BENADRYL) 25 MG tablet Take 50 mg by mouth at bedtime.    . fluticasone (FLONASE) 50 MCG/ACT nasal spray     . glucosamine-chondroitin 500-400 MG tablet Take 1 tablet by mouth 2 (two) times daily.    . hyoscyamine (LEVSIN, ANASPAZ) 0.125 MG tablet     . magnesium oxide (MAG-OX) 400 MG tablet Take 800 mg by mouth daily.  Pt takes [phone] mg daily for hotflashes.    . Multiple Vitamin (MULTIVITAMIN) tablet Take 1 tablet by mouth daily.    . Omega-3 Fatty Acids (FISH OIL) 1000 MG CAPS Take by mouth.    . tamoxifen (NOLVADEX) 20 MG tablet TAKE 1 TABLET DAILY 90 tablet 11  . traMADol (ULTRAM) 50 MG tablet Take 100 mg by mouth every 8 (eight) hours as needed.     . venlafaxine (EFFEXOR) 75 MG tablet Take 75 mg by mouth once.     No facility-administered medications prior to visit.     PAST MEDICAL HISTORY: Past Medical History:  Diagnosis Date  . Anxiety    . Arthritis    L FOOT  . Breast cancer ([place]) 11/13/13   left breast invasive ductal ca,  . Bronchitis, chronic ([place])   . Cancer [facility])    left breast cancer  . Depression   . Diverticulitis   . GERD (gastroesophageal reflux disease)   . Headache(784.0)    MIGRAINES  . High cholesterol   . History of shingles Feb. 2015  . IBS (irritable bowel syndrome)   . Meniscus tear    LEFT x 2  . OSA (obstructive sleep apnea) 04/30/2013   USES C -PAP  . PLMD (periodic limb movement disorder) 04/30/2013  . Pneumonia   . PVC's (premature ventricular contractions) 04/30/2013  . Radiation 02/06/14-03/24/14   Left breast ductal cancer  . Wears glasses     PAST SURGICAL HISTORY: Past Surgical History:  Procedure Laterality Date  . APPENDECTOMY  1984  . BREAST LUMPECTOMY Left 12/05/2013   Procedure: BREAST LUMPECTOMY WITH EXCISION OF SENTINEL NODE;  Surgeon: [name], MD;  Location: [facility];  Service: General;  Laterality: Left;  . DILATION AND CURETTAGE OF UTERUS    . KNEE ARTHROSCOPY Left 08/28/2013   Procedure: LEFT KNEE ARTHROSCOPY WITH MEDIAL AND LATERAL DEBRIDEMENT;  Surgeon: Gearlean Alf, MD;  Location: [facility];  Service: Orthopedics;  Laterality: Left;  . KNEE SURGERY Left 2000-2001    RT KNEE X1  . OSTEOTOMY     RT KNEE  . RE-EXCISION OF BREAST LUMPECTOMY Left 01/01/2014   Procedure: RE-EXCISION OF left BREAST cancer ;  Surgeon: [name], MD;  Location: [facility];  Service: General;  Laterality: Left;    FAMILY HISTORY: Family History  Problem Relation Age of Onset  . Cancer Father 66       esophageal and prostate cancer - smoker  . Learning disabilities Mother   . Cancer Paternal Aunt        breast at unknown age  . Cancer Cousin 15       Breast cancer in a paternal first cousin; BRCA2 + , IVS5-2A>G. Tested at [facility]  . Cancer Cousin 58       breast cancer in a paternal first cousin (sister to the cousin with the BRCA2 mutation)    SOCIAL  HISTORY: Social History   Social History  . Marital status: Married    Spouse name: N/A  . Number of children: 4  . Years of education: college   Occupational History  . [employer].    Social History Main Topics  . Smoking status: Never Smoker  . Smokeless tobacco: Never Used  . Alcohol use Yes     Comment: SOCIALLY  . Drug use: No  . Sexual activity: Not on file   Other Topics Concern  . Not on file   Social History Narrative  .  No narrative on file     PHYSICAL EXAM  Vitals:   05/16/17 1335  BP: 131/88  Pulse: 92  Weight: 205 lb 3.2 oz (93.1 kg)  Height: _0  (1.626 m)   Body mass index is 35.22 kg/m.  Generalized: Well developed,  Obese female in no acute distress  Head: normocephalic and atraumatic,. Oropharynx benign  Neck: Supple, no carotid bruits  Cardiac: Regular rate rhythm, no murmur  Musculoskeletal: No deformity   Neurological examination   Mentation: Alert oriented to time, place, history taking. Attention span and concentration appropriate. Recent and remote memory intact.  Follows all commands speech and language fluent.   Cranial nerve II-XII: Pupils were equal round reactive to light extraocular movements were full, visual field were full on confrontational test. Facial sensation and strength were normal. hearing was intact to finger rubbing bilaterally. Uvula tongue midline. head turning and shoulder shrug were normal and symmetric.Tongue protrusion into cheek strength was normal. Motor: normal bulk and tone, full strength in the BUE, BLE,  Sensory: normal and symmetric to light touch, in the upper and lower extremities Coordination: finger-nose-finger, heel-to-shin bilaterally, no dysmetria Gait and Station: Rising up from seated position without assistance, normal stance,  moderate stride, good arm swing, smooth turning, able to perform tiptoe, and heel walking without difficulty. Tandem gait is steady  DIAGNOSTIC DATA (LABS, IMAGING,  TESTING) - I reviewed patient records, labs, notes, testing and imaging myself where available.  Lab Results  Component Value Date   WBC 6.6 01/23/2017   HGB 13.0 01/23/2017   HCT 38.7 01/23/2017   MCV 93.3 01/23/2017   PLT 199 01/23/2017      Component Value Date/Time   NA 142 01/23/2017 1348   K 4.0 01/23/2017 1348   CL 103 12/03/2013 0939   CO2 26 01/23/2017 1348   GLUCOSE 108 01/23/2017 1348   BUN 15.8 01/23/2017 1348   CREATININE 0.8 01/23/2017 1348   CALCIUM 9.3 01/23/2017 1348   PROT 6.7 01/23/2017 1348   ALBUMIN 3.4 (L) 01/23/2017 1348   AST 18 01/23/2017 1348   ALT 19 01/23/2017 1348   ALKPHOS 66 01/23/2017 1348   BILITOT 0.22 01/23/2017 1348   GFRNONAA 88 (L) 12/03/2013 0939   GFRAA >90 12/03/2013 0939    ASSESSMENT AND PLAN  66 y.o. year old female  has a past medical history of  OSA (obstructive sleep apnea) (04/30/2013); PLMD (periodic limb movement disorder) (04/30/2013);  Here to follow-up for compliance with CPAP.  CPAP compliance data dated 916 2018- 05/15/2017 she was 100% compliance for 30 days  Greater than 4 hours  Which is excellent. Average usage 8 hours 5 minutes 58 seconds.  AHI 0.4 pressure set at 8 cm.  She reports she is doing well.  ESS 4  CPAP compliance at 100% F/U in 1 year I spent 15 min in total face to face time with the patient more than 50% of which was spent counseling and coordination of care, reviewing test results reviewing medications and discussing and reviewing the diagnosis of OSA and reviewed CPAP compliance report with patient Connie [name], [facility], [facility], [facility] [address] [place], [place] [address] [phone]  I reviewed the above note and documentation by the Nurse Practitioner and agree with the history, physical exam, assessment and plan as outlined above. I was immediately available for face-to-face consultation. Star Age, MD, PhD [facility])

## 2017-05-15 NOTE — Telephone Encounter (Signed)
As appt for 05-16-17 rescheduled from storm.

## 2017-05-16 ENCOUNTER — Ambulatory Visit (INDEPENDENT_AMBULATORY_CARE_PROVIDER_SITE_OTHER): Payer: Medicare Other | Admitting: Nurse Practitioner

## 2017-05-16 ENCOUNTER — Encounter: Payer: Self-pay | Admitting: Nurse Practitioner

## 2017-05-16 VITALS — BP 131/88 | HR 92 | Ht 64.0 in | Wt 205.2 lb

## 2017-05-16 DIAGNOSIS — G4733 Obstructive sleep apnea (adult) (pediatric): Secondary | ICD-10-CM

## 2017-05-16 NOTE — Patient Instructions (Signed)
CPAP compliance at 100% F/U in 1 year

## 2017-05-17 ENCOUNTER — Telehealth: Payer: Self-pay | Admitting: *Deleted

## 2017-05-17 NOTE — Telephone Encounter (Signed)
-----   Message from Patsy Baltimore sent at 05/17/2017  2:46 PM EDT ----- Regarding: RE: face to face  Thank you Katharine Look I have pulled and sent into our office.   Angie  ----- Message ----- From: Brandon Melnick, RN Sent: 05/17/2017   2:05 PM To: Mary A Ozimek Subject: face to face                                   This pt was in office yesterday (new medicare pt, and had to come in for face to face).  Wanted to let you know.  Liane Comber, RN at [employer]

## 2017-05-29 ENCOUNTER — Ambulatory Visit: Payer: BLUE CROSS/BLUE SHIELD | Admitting: Neurology

## 2017-09-06 ENCOUNTER — Other Ambulatory Visit: Payer: Self-pay | Admitting: Oncology

## 2017-10-09 ENCOUNTER — Other Ambulatory Visit: Payer: Self-pay | Admitting: Oncology

## 2017-12-20 ENCOUNTER — Encounter: Payer: Self-pay | Admitting: Internal Medicine

## 2018-02-21 ENCOUNTER — Other Ambulatory Visit: Payer: Self-pay | Admitting: Oncology

## 2018-03-14 ENCOUNTER — Telehealth: Payer: Self-pay | Admitting: Oncology

## 2018-03-14 NOTE — Telephone Encounter (Signed)
Minette Brine called from [facility] and requested medical records to be faxed to [phone], Release ID: [id number]

## 2018-04-11 ENCOUNTER — Telehealth: Payer: Self-pay | Admitting: Adult Health

## 2018-04-11 ENCOUNTER — Other Ambulatory Visit: Payer: Self-pay | Admitting: Oncology

## 2018-04-11 DIAGNOSIS — Z853 Personal history of malignant neoplasm of breast: Secondary | ICD-10-CM

## 2018-04-11 NOTE — Telephone Encounter (Signed)
Patient called to reschedule  °

## 2018-05-16 ENCOUNTER — Other Ambulatory Visit: Payer: Self-pay | Admitting: Oncology

## 2018-05-17 ENCOUNTER — Ambulatory Visit: Payer: BLUE CROSS/BLUE SHIELD | Admitting: Nurse Practitioner

## 2018-05-21 ENCOUNTER — Other Ambulatory Visit: Payer: BLUE CROSS/BLUE SHIELD

## 2018-05-21 ENCOUNTER — Ambulatory Visit: Payer: BLUE CROSS/BLUE SHIELD | Admitting: Oncology

## 2018-06-21 ENCOUNTER — Other Ambulatory Visit: Payer: BLUE CROSS/BLUE SHIELD

## 2018-06-21 ENCOUNTER — Ambulatory Visit: Payer: BLUE CROSS/BLUE SHIELD | Admitting: Adult Health

## 2018-07-02 ENCOUNTER — Ambulatory Visit
Admission: RE | Admit: 2018-07-02 | Discharge: 2018-07-02 | Disposition: A | Discharge status: Home / Self Care | Payer: Medicare HMO | Source: Ambulatory Visit | Attending: Oncology | Admitting: Oncology

## 2018-07-02 ENCOUNTER — Other Ambulatory Visit: Payer: Self-pay | Admitting: *Deleted

## 2018-07-02 DIAGNOSIS — C50312 Malignant neoplasm of lower-inner quadrant of left female breast: Secondary | ICD-10-CM

## 2018-07-02 DIAGNOSIS — Z170 Estrogen receptor positive status [ER+]: Principal | ICD-10-CM

## 2018-07-02 DIAGNOSIS — Z853 Personal history of malignant neoplasm of breast: Secondary | ICD-10-CM

## 2018-07-03 ENCOUNTER — Inpatient Hospital Stay (HOSPITAL_BASED_OUTPATIENT_CLINIC_OR_DEPARTMENT_OTHER): Payer: Medicare HMO | Admitting: Oncology

## 2018-07-03 ENCOUNTER — Inpatient Hospital Stay: Payer: Medicare HMO | Attending: Oncology

## 2018-07-03 VITALS — BP 144/98 | HR 79 | Temp 98.8°F | Resp 18 | Ht 64.0 in | Wt 207.6 lb

## 2018-07-03 DIAGNOSIS — Z170 Estrogen receptor positive status [ER+]: Secondary | ICD-10-CM | POA: Diagnosis not present

## 2018-07-03 DIAGNOSIS — Z79810 Long term (current) use of selective estrogen receptor modulators (SERMs): Secondary | ICD-10-CM | POA: Diagnosis not present

## 2018-07-03 DIAGNOSIS — C50312 Malignant neoplasm of lower-inner quadrant of left female breast: Secondary | ICD-10-CM | POA: Insufficient documentation

## 2018-07-03 DIAGNOSIS — Z7982 Long term (current) use of aspirin: Secondary | ICD-10-CM

## 2018-07-03 DIAGNOSIS — Z79899 Other long term (current) drug therapy: Secondary | ICD-10-CM | POA: Diagnosis not present

## 2018-07-03 DIAGNOSIS — Z923 Personal history of irradiation: Secondary | ICD-10-CM | POA: Diagnosis not present

## 2018-07-03 LAB — CBC WITH DIFFERENTIAL (CANCER CENTER ONLY)
Abs Immature Granulocytes: 0.01 10*3/uL (ref 0.00–0.07)
BASOS ABS: 0 10*3/uL (ref 0.0–0.1)
BASOS PCT: 0 %
Eosinophils Absolute: 0.2 10*3/uL (ref 0.0–0.5)
Eosinophils Relative: 2 %
HEMATOCRIT: 41.7 % (ref 36.0–46.0)
HEMOGLOBIN: 13.6 g/dL (ref 12.0–15.0)
Immature Granulocytes: 0 %
Lymphocytes Relative: 28 %
Lymphs Abs: 2.2 10*3/uL (ref 0.7–4.0)
MCH: 30.3 pg (ref 26.0–34.0)
MCHC: 32.6 g/dL (ref 30.0–36.0)
MCV: 92.9 fL (ref 80.0–100.0)
MONOS PCT: 10 %
Monocytes Absolute: 0.7 10*3/uL (ref 0.1–1.0)
NEUTROS ABS: 4.6 10*3/uL (ref 1.7–7.7)
NEUTROS PCT: 60 %
Platelet Count: 228 10*3/uL (ref 150–400)
RBC: 4.49 MIL/uL (ref 3.87–5.11)
RDW: 12.7 % (ref 11.5–15.5)
WBC Count: 7.7 10*3/uL (ref 4.0–10.5)
nRBC: 0 % (ref 0.0–0.2)

## 2018-07-03 LAB — CMP (CANCER CENTER ONLY)
ALBUMIN: 3.6 g/dL (ref 3.5–5.0)
ALT: 27 U/L (ref 0–44)
ANION GAP: 10 (ref 5–15)
AST: 22 U/L (ref 15–41)
Alkaline Phosphatase: 83 U/L (ref 38–126)
BILIRUBIN TOTAL: 0.3 mg/dL (ref 0.3–1.2)
BUN: 12 mg/dL (ref 8–23)
CHLORIDE: 104 mmol/L (ref 98–111)
CO2: 28 mmol/L (ref 22–32)
Calcium: 9.7 mg/dL (ref 8.9–10.3)
Creatinine: 0.85 mg/dL (ref 0.44–1.00)
GFR, Est AFR Am: >60 mL/min (ref >60)
GFR, Estimated: >60 mL/min (ref >60)
GLUCOSE: 92 mg/dL (ref 70–99)
Potassium: 3.9 mmol/L (ref 3.5–5.1)
SODIUM: 142 mmol/L (ref 135–145)
TOTAL PROTEIN: 7.2 g/dL (ref 6.5–8.1)

## 2018-07-03 MED ORDER — CLONIDINE 0.1 MG/24HR TD PTWK: 0.1000 mg | MEDICATED_PATCH | TRANSDERMAL | 12 refills | Status: DC

## 2018-07-03 MED ORDER — TAMOXIFEN CITRATE 20 MG PO TABS: 20.0000 mg | ORAL_TABLET | Freq: Every day | ORAL | 11 refills | Status: AC

## 2018-07-03 NOTE — Progress Notes (Signed)
Connie [name]  [phone] [phone]) [phone]     ID: Connie [name] DOB: 03/17/1951  MR#: [id number]  WFU#:[id number]  PCP: Marton Redwood, MD GYN: [name] SU: [name] OTHER MD: [name]. Connie [name]  CHIEF COMPLAINT: Estrogen receptor positive breast cancer  CURRENT TREATMENT:  tamoxifen   BREAST CANCER HISTORY: From the original intake note:  The patient herself palpated a mass in her left breast around Christmastime 2014 but was about to undergo knee surgery at the time and did not immediately followup on this. On 11/06/2013 she brought it to Dr. [name] attention and he scheduled her for bilateral diagnostic mammography and left ultrasonography at the [facility] Center. This showed a 2.4 cm mass in the left lower inner quadrant, with lobulated margins. This was palpable and appeared fixed. Ultrasound confirmed an irregular hypoechoic solid mass at the 8:30 position 5 cm from the nipple measuring 2.1 cm. Medial to this was an oval hypoechoic mass measuring 0.7 cm and inferior to this there were at least 2 additional hypoechoic nodules, the largest measuring 0.8 cm. Ultrasound of the left axilla was negative.  Ultrasound-guided biopsy of the 2 more prominent left breast masses 11/13/2013 showed ([id number]) one of the masses to be benign. The other one was an invasive ductal carcinoma, grade 1 estrogen receptor 100% positive, progesterone receptor 91% positive, both with strong staining intensity, with an MIB-1 of 89%, and no HER-2 amplification, the signals ratio being 0.93 and the number per cell 1.95.  On 11/21/2013 the patient underwent bilateral breast MRI. This showed, in the 8:00 position of the left breast, a 2.6 cm enhancing mass with irregular borders. Lateral to this there was a second biopsy marker clip surrounded by minimal enhancement. There was no mass in that area. Ultrasound had shown evidence of possible satellite masses inferior  to the dominant mass but these were not seen by MRI. There were no other findings, the right breast was normal, and the left axilla showed no abnormal notes.  On 12/05/2013 the patient underwent left lumpectomy and sentinel lymph node sampling. This showed a 2.9 cm invasive ductal carcinoma, grade 3, less than a millimeter from the closest margin. 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 Estrogen receptor and progesterone receptor studies were obtained on the DCIS and are positive at 100% for the estrogen receptor and 39%, progesterone receptor, both with strong staining intensity.  On 01/01/2014 the patient underwent additional surgery for margin clearance. This showed no residual tumor.  In addition, an Oncotype scan was obtained from the invasive tumor (lumpectomy specimen) and showed a score of 15, predicting a ten-year risk of outside the breast recurrence of 10% if the patient's only systemic treatment was tamoxifen for 5 years. It also predicted no benefit from chemotherapy.  The patient was evaluated by Dr. Pablo Ledger and started radiation treatments 02/11/2014. She also met with genetics, since there was a documented BRCA 2 mutation in a first cousin. The patient was tested specifically for this mutation, and was found to be negative.  Her subsequent history is as detailed below  INTERVAL HISTORY: [name] returns today for follow-up of her estrogen receptor positive breast cancer.  She continues on tamoxifen, with good tolerance.  She did have some vaginal bleeding and while she was in [place] she had her local gynecologist do a hysteroscopy which was reportedly benign.  Unfortunately for family reasons they are moving back to [place].  This is her last scheduled visit here.  REVIEW OF SYSTEMS: She has had no significant problems with hot flashes, vaginal wetness, or other issues.  She is not exercising regularly, but is planning to  start she says.  Detailed review of systems today was otherwise noncontributory  PAST MEDICAL HISTORY: Past Medical History:  Diagnosis Date  . Anxiety   . Arthritis    L FOOT  . Breast cancer ([place]) 11/13/13   left breast invasive ductal ca,  . Bronchitis, chronic ([place])   . Cancer [facility])    left breast cancer  . Depression   . Diverticulitis   . GERD (gastroesophageal reflux disease)   . Headache(784.0)    MIGRAINES  . High cholesterol   . History of shingles Feb. 2015  . IBS (irritable bowel syndrome)   . Meniscus tear    LEFT x 2  . OSA (obstructive sleep apnea) 04/30/2013   USES C -PAP  . PLMD (periodic limb movement disorder) 04/30/2013  . Pneumonia   . PVC's (premature ventricular contractions) 04/30/2013  . Radiation 02/06/14-03/24/14   Left breast ductal cancer  . Wears glasses     PAST SURGICAL HISTORY: Past Surgical History:  Procedure Laterality Date  . APPENDECTOMY  1984  . BREAST LUMPECTOMY Left 12/05/2013   Procedure: BREAST LUMPECTOMY WITH EXCISION OF SENTINEL NODE;  Surgeon: [name], MD;  Location: [facility];  Service: General;  Laterality: Left;  . DILATION AND CURETTAGE OF UTERUS    . KNEE ARTHROSCOPY Left 08/28/2013   Procedure: LEFT KNEE ARTHROSCOPY WITH MEDIAL AND LATERAL DEBRIDEMENT;  Surgeon: Gearlean Alf, MD;  Location: [facility];  Service: Orthopedics;  Laterality: Left;  . KNEE SURGERY Left 2000-2001    RT KNEE X1  . OSTEOTOMY     RT KNEE  . RE-EXCISION OF BREAST LUMPECTOMY Left 01/01/2014   Procedure: RE-EXCISION OF left BREAST cancer ;  Surgeon: [name], MD;  Location: [facility];  Service: General;  Laterality: Left;    FAMILY HISTORY Family History  Problem Relation Age of Onset  . Cancer Father 72       esophageal and prostate cancer - smoker  . Learning disabilities Mother   . Cancer Paternal Aunt        breast at unknown age  . Cancer Cousin 58       Breast cancer in a paternal first cousin; BRCA2 + , IVS5-2A>G. Tested  at [facility]  . Cancer Cousin 75       breast cancer in a paternal first cousin (sister to the cousin with the BRCA2 mutation)   the patient's father died from esophageal cancer at age 72. The patient's mother is living, at age 73. The patient had one brother, 2 sisters. One of the patient's father's 2 sisters was diagnosed with breast cancer and that aunt has 3 daughters, one of whom had breast cancer at an early age. The daughter was tested and was found to carry a BRCA2 mutation which Morning however does not carry.  GYNECOLOGIC HISTORY:  No LMP recorded. Patient is postmenopausal. Menarche age 67, first live birth age 60, which the patient understands increases the risk of breast cancer. She stopped having periods approximately 10 years ago and has been on hormone replacement since that time, stopping in April of 2015.  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 She retired in 2015. Her  husband Frederico Hamman worked as an Clinical biochemist. He is also retired. This is his second marriage for both of them. She has 2 children from her first marriage, Gaylord Shih, who works in [employer] in [place], and [employer], who works in Pharmacologist and lives in [place]. Frederico Hamman has 2 children, Anderson Malta lives in [place] and Girard who lives in [place]. Anderson Malta is a stay-at-home mom Wendi Maya is a Education officer, museum. They have one grandson, who lives in [place]: In place   HEALTH MAINTENANCE: Social History   Tobacco Use  . Smoking status: Never Smoker  . Smokeless tobacco: Never Used  Substance Use Topics  . Alcohol use: Yes    Comment: SOCIALLY  . Drug use: No     Colonoscopy: 2009, under lab our. She also had EGD at the same time given her father's history of esophageal cancer  PAP: 2014  Bone density: 2014 at [facility]; lowest T score was -0.9 (normal)  Lipid  panel:  No Known Allergies  Current Outpatient Medications  Medication Sig Dispense Refill  . aspirin 81 MG tablet Take 81 mg by mouth daily.    Marland Kitchen atenolol (TENORMIN) 25 MG tablet Take 25 mg by mouth every morning.     Marland Kitchen atorvastatin (LIPITOR) 40 MG tablet Take 40 mg by mouth every evening.     Marland Kitchen b complex vitamins tablet Take 1 tablet by mouth daily.    . butalbital-aspirin-caffeine (FIORINAL) 50-325-40 MG per capsule Take 1-2 capsules by mouth 2 (two) times daily as needed for headache.     . cloNIDine (CATAPRES - DOSED IN MG/24 HR) 0.1 mg/24hr patch PLACE 1 PATCH ON THE SKIN ONCE A WEEK 4 patch 12  . cyclobenzaprine (FLEXERIL) 10 MG tablet Take 10 mg by mouth daily as needed for muscle spasms.    . diphenhydrAMINE (BENADRYL) 25 MG tablet Take 50 mg by mouth at bedtime.    . fluticasone (FLONASE) 50 MCG/ACT nasal spray     . glucosamine-chondroitin 500-400 MG tablet Take 1 tablet by mouth 2 (two) times daily.    . hyoscyamine (LEVSIN, ANASPAZ) 0.125 MG tablet     . magnesium oxide (MAG-OX) 400 MG tablet Take 800 mg by mouth daily. Pt takes [phone] daily for hotflashes.    . Multiple Vitamin (MULTIVITAMIN) tablet Take 1 tablet by mouth daily.    . Omega-3 Fatty Acids (FISH OIL) 1000 MG CAPS Take by mouth.    . tamoxifen (NOLVADEX) 20 MG tablet TAKE 1 TABLET DAILY 90 tablet 11  . traMADol (ULTRAM) 50 MG tablet Take 100 mg by mouth every 8 (eight) hours as needed.     . venlafaxine (EFFEXOR) 75 MG tablet Take 75 mg by mouth once.     No current facility-administered medications for this visit.     OBJECTIVE: Middle-aged white woman appears well  Vitals:   07/03/18 1402  BP: (!) 144/98  Pulse: 79  Resp: 18  Temp: 98.8 F (37.1 C)  SpO2: 97%     Body mass index is 35.63 kg/m.    ECOG FS:1 - Symptomatic but completely ambulatory  Sclerae unicteric, pupils round and equal No cervical or supraclavicular adenopathy Lungs no rales or rhonchi Heart regular rate and rhythm Abd  soft, nontender, positive bowel sounds MSK no focal spinal tenderness, no upper extremity lymphedema Neuro: nonfocal, well oriented, appropriate affect Breasts: The right breast is unremarkable.  The left breast status post lumpectomy and radiation.  There is no evidence of  local recurrence.  Both axillae are benign.  LAB RESULTS:  CMP     Component Value Date/Time   NA 142 01/23/2017 1348   K 4.0 01/23/2017 1348   CL 103 12/03/2013 0939   CO2 26 01/23/2017 1348   GLUCOSE 108 01/23/2017 1348   BUN 15.8 01/23/2017 1348   CREATININE 0.8 01/23/2017 1348   CALCIUM 9.3 01/23/2017 1348   PROT 6.7 01/23/2017 1348   ALBUMIN 3.4 (L) 01/23/2017 1348   AST 18 01/23/2017 1348   ALT 19 01/23/2017 1348   ALKPHOS 66 01/23/2017 1348   BILITOT 0.22 01/23/2017 1348   GFRNONAA 88 (L) 12/03/2013 0939   GFRAA >90 12/03/2013 0939    I No results found for: SPEP  Lab Results  Component Value Date   WBC 7.7 07/03/2018   NEUTROABS 4.6 07/03/2018   HGB 13.6 07/03/2018   HCT 41.7 07/03/2018   MCV 92.9 07/03/2018   PLT 228 07/03/2018      Chemistry      Component Value Date/Time   NA 142 01/23/2017 1348   K 4.0 01/23/2017 1348   CL 103 12/03/2013 0939   CO2 26 01/23/2017 1348   BUN 15.8 01/23/2017 1348   CREATININE 0.8 01/23/2017 1348      Component Value Date/Time   CALCIUM 9.3 01/23/2017 1348   ALKPHOS 66 01/23/2017 1348   AST 18 01/23/2017 1348   ALT 19 01/23/2017 1348   BILITOT 0.22 01/23/2017 1348       No results found for: LABCA2  No components found for: LABCA125  No results for input(s): INR in the last 168 hours.  Urinalysis    Component Value Date/Time   COLORURINE YELLOW 12/15/2008 0701   APPEARANCEUR HAZY (A) 12/15/2008 0701   LABSPEC 1.031 (H) 12/15/2008 0701   PHURINE 5.5 12/15/2008 0701   GLUCOSEU NEGATIVE 12/15/2008 0701   HGBUR TRACE (A) 12/15/2008 0701   BILIRUBINUR NEGATIVE 12/15/2008 0701   KETONESUR NEGATIVE 12/15/2008 0701   PROTEINUR NEGATIVE  12/15/2008 0701   UROBILINOGEN 0.2 12/15/2008 0701   NITRITE NEGATIVE 12/15/2008 0701   LEUKOCYTESUR NEGATIVE 12/15/2008 0701    STUDIES: Mm Diag Breast Tomo Bilateral  Result Date: 07/02/2018 CLINICAL DATA:  Malignant lumpectomy of the LOWER INNER QUADRANT of the LEFT breast in 2015 with adjuvant radiation therapy. Patient currently undergoing hormonal chemoprevention with tamoxifen.Annual evaluation. EXAM: DIGITAL DIAGNOSTIC BILATERAL MAMMOGRAM WITH CAD AND TOMO COMPARISON:  01/20/2017, 12/31/2015 and earlier. ACR Breast Density Category c: The breast tissue is heterogeneously dense, which may obscure small masses. FINDINGS: Tomosynthesis and synthesized full field CC and MLO views of both breasts were obtained. Standard spot magnification CC view of the lumpectomy site in the LEFT breast was also obtained. Post surgical scar/architectural distortion at the lumpectomy site in the LOWER INNER LEFT breast at ANTERIOR to MIDDLE depth. The previously identified seroma at the lumpectomy site has resolved. Stable surgical scar in the low LEFT axilla at the site of node removal. No new or suspicious findings in the LEFT breast. No findings suspicious for malignancy in the RIGHT breast. Mammographic images were processed with CAD. IMPRESSION: 1. No mammographic evidence of malignancy involving either breast. 2. Expected post lumpectomy changes involving the LEFT breast. RECOMMENDATION: BILATERAL diagnostic mammography in 1 year. The patient states that she is in the process of moving to [place] and her subsequent imaging will be performed there. I have discussed the findings and recommendations with the patient. Results were also provided in writing at the  conclusion of the visit. If applicable, a reminder letter will be sent to the patient regarding the next appointment. BI-RADS CATEGORY  2: Benign. Electronically Signed   By: Evangeline Dakin M.D.   On: 07/02/2018 10:31     ASSESSMENT: 67 y.o.  [place] woman s/p left breast biopsy 11/13/2013 for a clinical T2 N0, stage IIA invasive ductal carcinoma, grade 1, estrogen and progesterone receptor positive, HER-2 negative, with an MIB-1 of 89%  (1) status post left lumpectomy and sentinel lymph node sampling 12/05/2013 for a pT2 pN0, stage IIA invasive ductal carcinoma, grade 2, together with ductal carcinoma in situ, which was also estrogen and progesterone receptor positive margins were involved  (2) status post additional surgery 01/01/2014 for margin clearance, showing no residual tumor  (3) Oncotype score of 15 predicted in a 10 year outside the breast risk of recurrence of 10% if the patient's only systemic treatment was tamoxifen for 5 years. It also predicted no benefit from chemotherapy  (4) adjuvant radiation completed 03/24/2014  (5) tamoxifen started 04/11/2014  (6) the patient was tested for a BRCA2 mutation documented in other family members, c.476-2A>G (IVS5-2A>G). She does not carry this mutation   PLAN: Maralee is now 4-1/2 years out from definitive surgery for breast cancer with no evidence of disease recurrence.  This is very favorable.  She has about a year of tamoxifen to go.  After that she can consider switching to anastrozole for 2 years, continuing tamoxifen for another 5 years, stopping antiestrogens, or doing a breast index to decide which of those options is best for her  For family reasons that she explained today they are moving back to [facility] so this will be her last visit here.  She is planning to get hooked up with the [facility] oncologists there and I think that is a good idea.  I gave her a copy of her treatment summary, and we have contacted the Breast Center to make sure they give her a disc with a copy of her mammograms  She knows I will be glad to see her again at any point in the future if and when the occasion arises.    Chauncey Cruel, MD   07/03/2018 2:19 PM

## 2019-02-20 ENCOUNTER — Telehealth: Payer: Self-pay

## 2019-02-20 NOTE — Telephone Encounter (Signed)
Requested documents faxed to [facility]

## 2019-08-27 ENCOUNTER — Other Ambulatory Visit: Payer: Self-pay | Admitting: Oncology

## 2019-11-11 IMAGING — [facility] DIGITAL DIAGNOSTIC BILATERAL MAMMOGRAM WITH TOMO AND CAD
6 of 9 series · 6 of 25 positions shown · non-contrast
Comparison: 01/20/2017, 12/31/2015 and earlier.

CLINICAL DATA: Malignant lumpectomy of the LOWER INNER QUADRANT of
the LEFT breast in 7284 with adjuvant radiation therapy. Patient
currently undergoing hormonal chemoprevention with tamoxifen.Annual
evaluation.

EXAM:
DIGITAL DIAGNOSTIC BILATERAL MAMMOGRAM WITH CAD AND TOMO

[L CC]
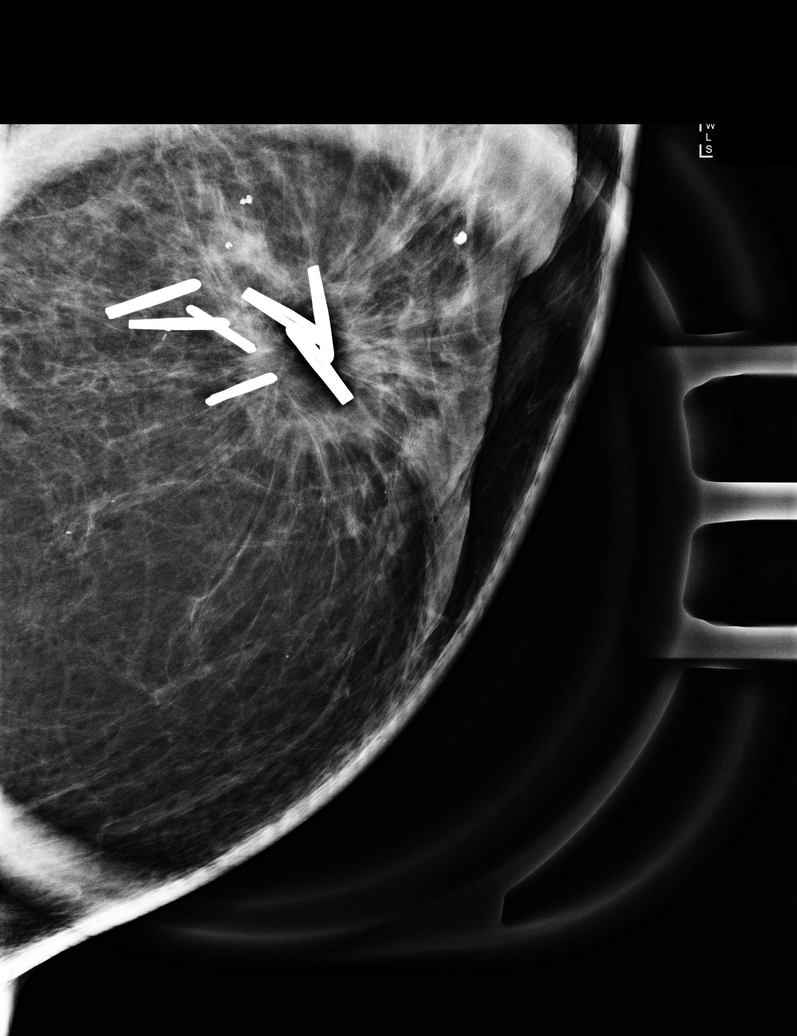

[L MLO synth-2D]
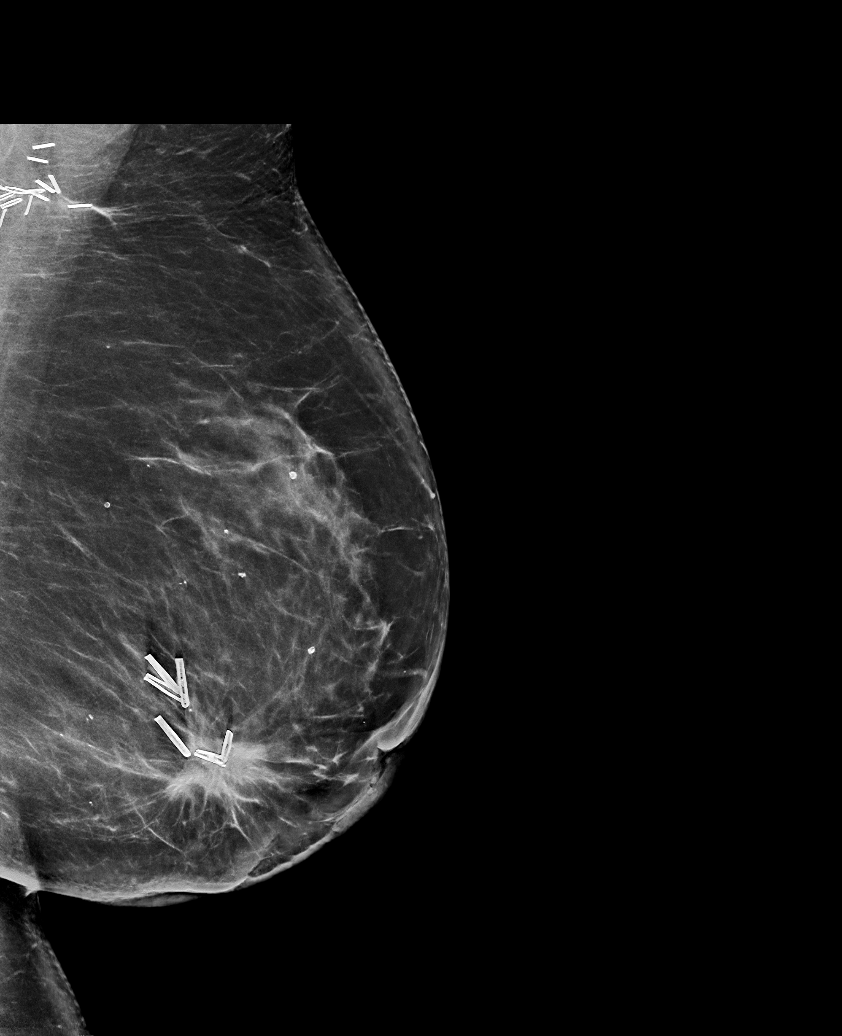

[R MLO synth-2D]
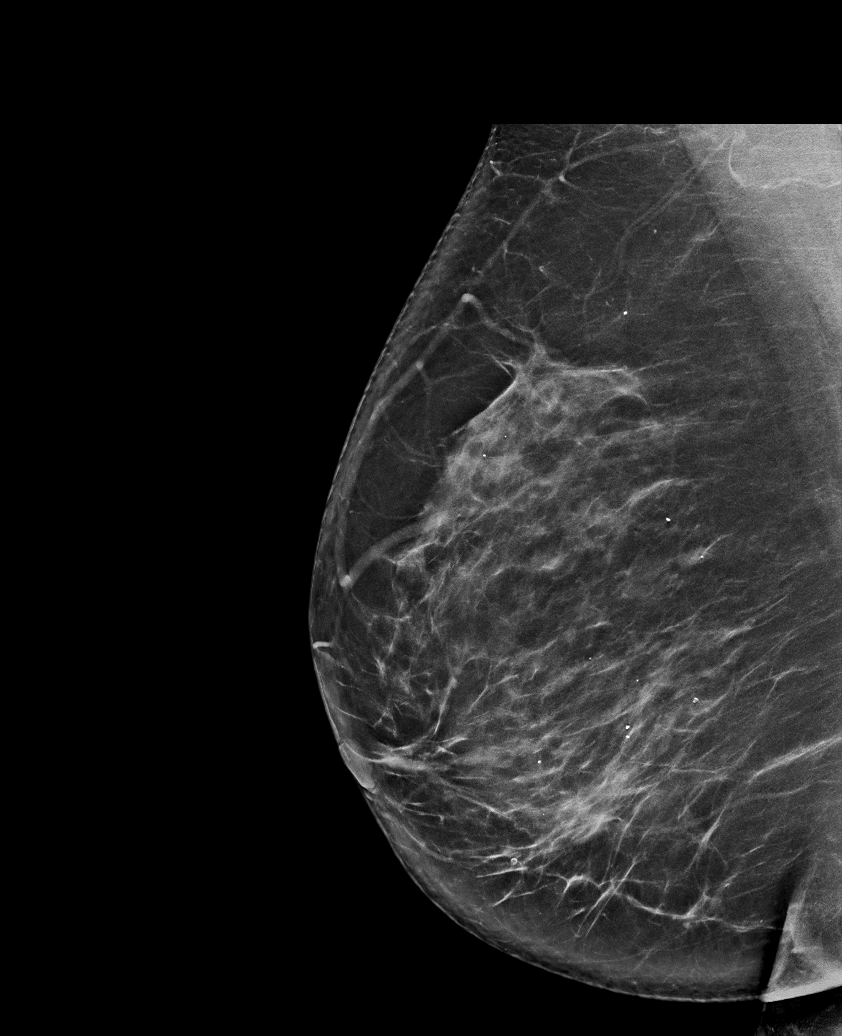

[L CC synth-2D]
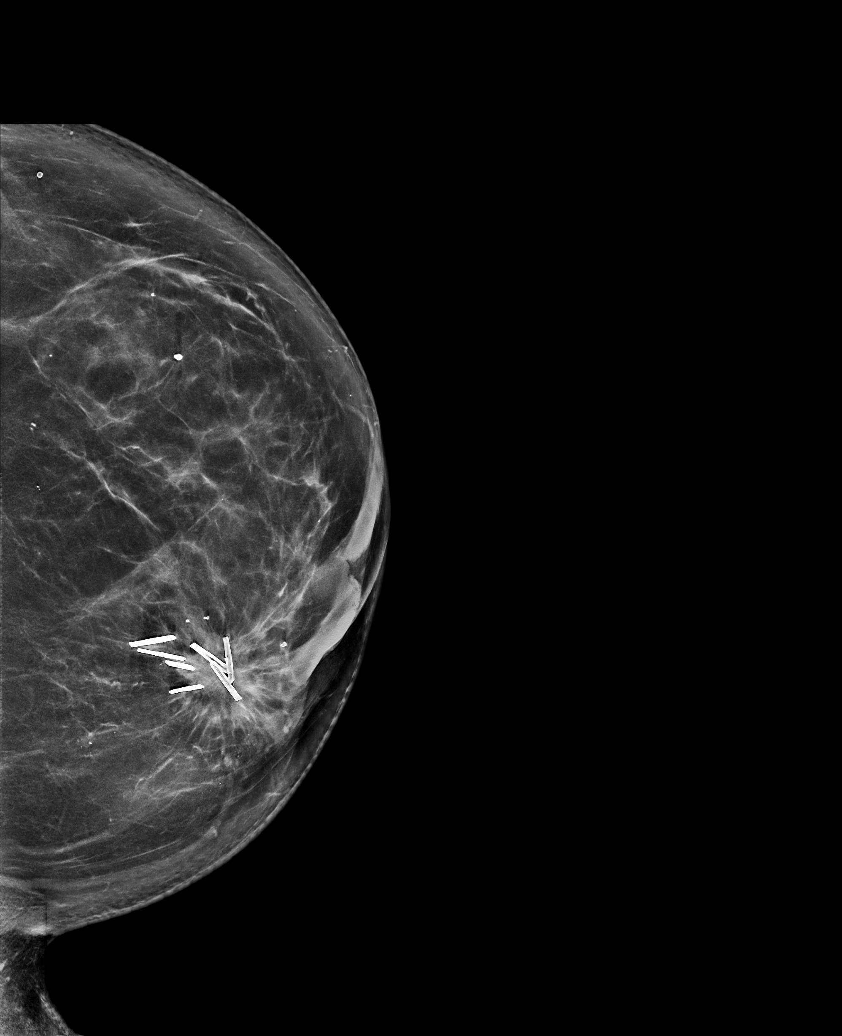

[R CC synth-2D]
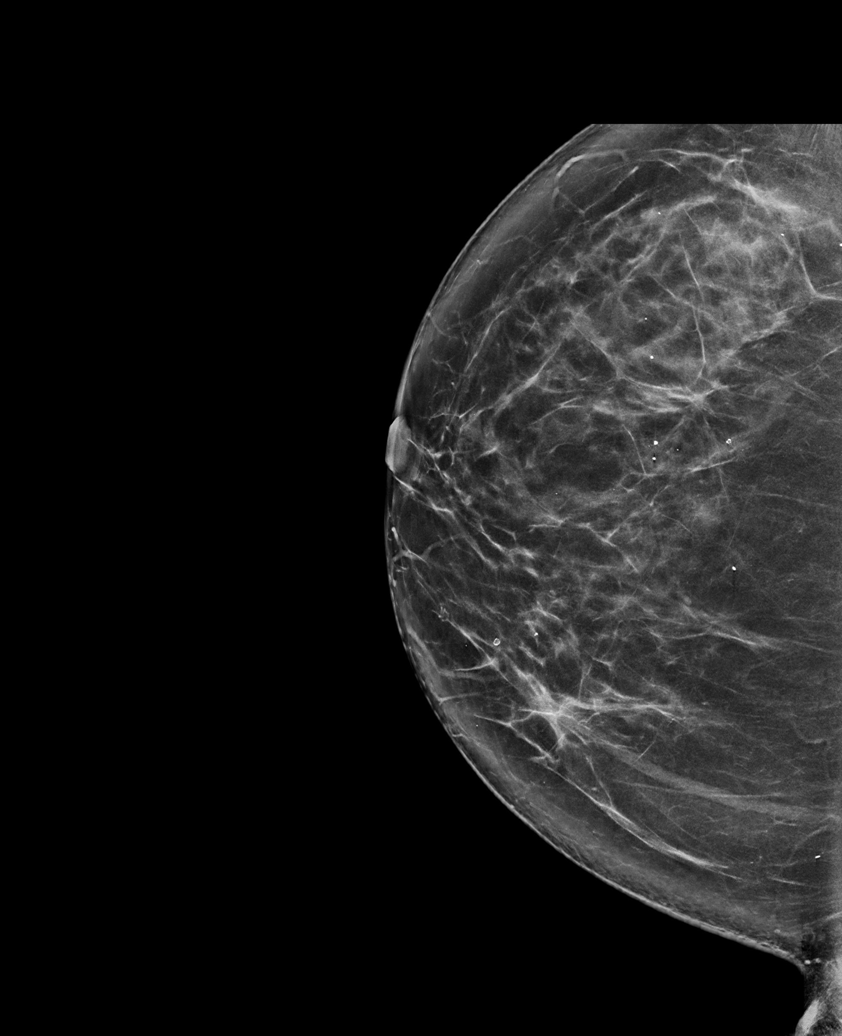

[L CC tomo · tomo slice 47/92.0]
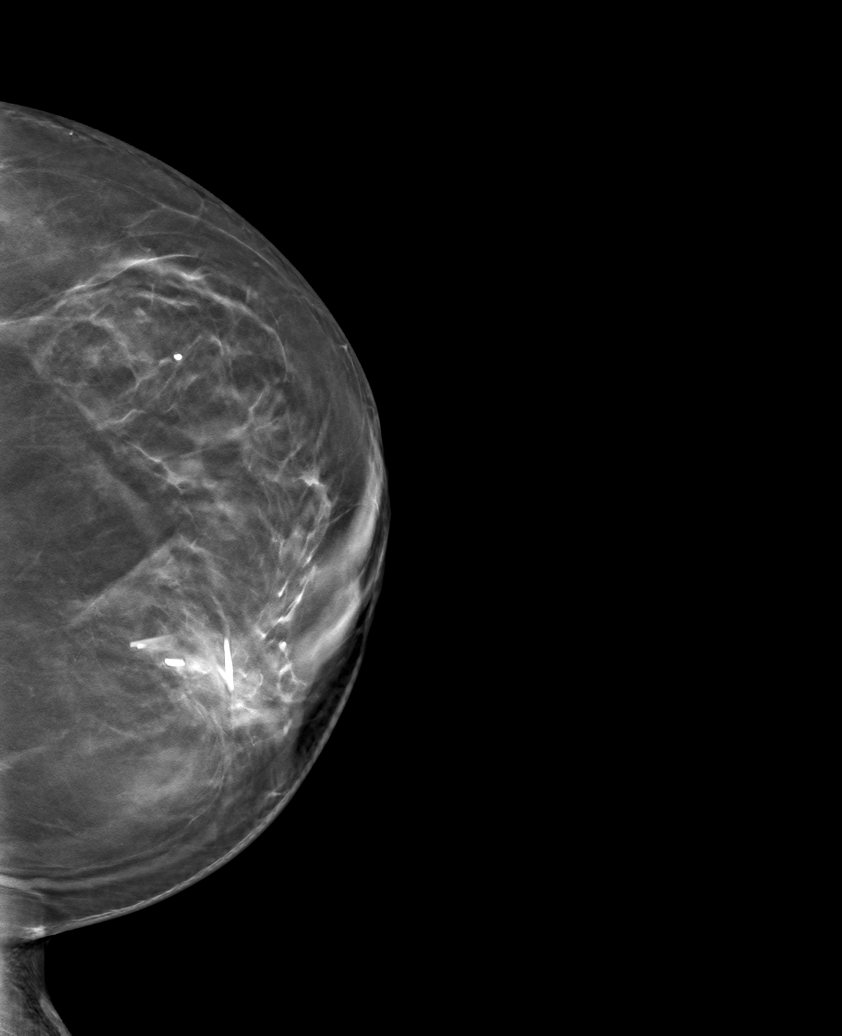

[6 of 25 positions shown; findings below may reference images not displayed]

ACR Breast Density Category c: The breast tissue is heterogeneously
dense, which may obscure small masses.
FINDINGS: Tomosynthesis and synthesized full field CC and MLO views of both
breasts were obtained. Standard spot magnification CC view of the
lumpectomy site in the LEFT breast was also obtained.

Post surgical scar/architectural distortion at the lumpectomy site
in the LOWER INNER LEFT breast at ANTERIOR to MIDDLE depth. The
previously identified seroma at the lumpectomy site has resolved.
Stable surgical scar in the low LEFT axilla at the site of node
removal. No new or suspicious findings in the LEFT breast.

No findings suspicious for malignancy in the RIGHT breast.

Mammographic images were processed with CAD.
IMPRESSION: 1. No mammographic evidence of malignancy involving either breast.
2. Expected post lumpectomy changes involving the LEFT breast.

RECOMMENDATION:
BILATERAL diagnostic mammography in 1 year.

The patient states that she is in the process of moving to [place] and her subsequent imaging will be performed there.

I have discussed the findings and recommendations with the patient.
Results were also provided in writing at the conclusion of the
visit. If applicable, a reminder letter will be sent to the patient
regarding the next appointment.

BI-RADS CATEGORY  2: Benign.

## 2021-08-26 ENCOUNTER — Telehealth: Payer: Self-pay | Admitting: *Deleted

## 2021-08-26 NOTE — Telephone Encounter (Signed)
Received request for pressure settings on pts cpap for they can order new cpap machine.  [facility] [phone], fax [phone].  Last compliance report was faxed to them 05/2017.  CPAP pressure was 8cm.  Confirmation received.
